# Patient Record
Sex: Female | Born: 1940 | Race: White | Hispanic: No | Marital: Single | Smoking: Former smoker
Health system: Southern US, Community
[De-identification: ages and names within clinical notes are randomized; demographics above are authoritative.]

## PROBLEM LIST (undated history)

## (undated) DIAGNOSIS — C801 Malignant (primary) neoplasm, unspecified: Secondary | ICD-10-CM

## (undated) DIAGNOSIS — I10 Essential (primary) hypertension: Secondary | ICD-10-CM

## (undated) DIAGNOSIS — J449 Chronic obstructive pulmonary disease, unspecified: Secondary | ICD-10-CM

## (undated) DIAGNOSIS — N189 Chronic kidney disease, unspecified: Secondary | ICD-10-CM

## (undated) DIAGNOSIS — R5381 Other malaise: Secondary | ICD-10-CM

---

## 2003-12-29 ENCOUNTER — Emergency Department (HOSPITAL_COMMUNITY): Admission: EM | Admit: 2003-12-29 | Discharge: 2003-12-29 | Payer: Self-pay | Admitting: Emergency Medicine

## 2004-01-02 ENCOUNTER — Emergency Department (HOSPITAL_COMMUNITY): Admission: EM | Admit: 2004-01-02 | Discharge: 2004-01-02 | Payer: Self-pay | Admitting: Emergency Medicine

## 2006-01-01 ENCOUNTER — Emergency Department (HOSPITAL_COMMUNITY): Admission: EM | Admit: 2006-01-01 | Discharge: 2006-01-01 | Payer: Self-pay | Admitting: Emergency Medicine

## 2006-11-09 ENCOUNTER — Emergency Department (HOSPITAL_COMMUNITY): Admission: EM | Admit: 2006-11-09 | Discharge: 2006-11-09 | Payer: Self-pay | Admitting: Emergency Medicine

## 2006-11-17 ENCOUNTER — Emergency Department (HOSPITAL_COMMUNITY): Admission: EM | Admit: 2006-11-17 | Discharge: 2006-11-18 | Payer: Self-pay | Admitting: Emergency Medicine

## 2006-11-21 ENCOUNTER — Inpatient Hospital Stay (HOSPITAL_COMMUNITY): Admission: EM | Admit: 2006-11-21 | Discharge: 2006-11-24 | Payer: Self-pay | Admitting: Emergency Medicine

## 2006-11-24 ENCOUNTER — Emergency Department (HOSPITAL_COMMUNITY): Admission: EM | Admit: 2006-11-24 | Discharge: 2006-11-24 | Payer: Self-pay | Admitting: Emergency Medicine

## 2007-10-15 ENCOUNTER — Emergency Department (HOSPITAL_COMMUNITY): Admission: EM | Admit: 2007-10-15 | Discharge: 2007-10-15 | Payer: Self-pay | Admitting: Emergency Medicine

## 2008-11-11 ENCOUNTER — Inpatient Hospital Stay (HOSPITAL_COMMUNITY): Admission: EM | Admit: 2008-11-11 | Discharge: 2008-11-16 | Payer: Self-pay | Admitting: Emergency Medicine

## 2008-11-13 ENCOUNTER — Ambulatory Visit: Payer: Self-pay | Admitting: Cardiology

## 2008-11-13 ENCOUNTER — Encounter (INDEPENDENT_AMBULATORY_CARE_PROVIDER_SITE_OTHER): Payer: Self-pay | Admitting: Internal Medicine

## 2008-11-14 ENCOUNTER — Ambulatory Visit: Payer: Self-pay | Admitting: Cardiology

## 2009-05-23 ENCOUNTER — Inpatient Hospital Stay (HOSPITAL_COMMUNITY): Admission: EM | Admit: 2009-05-23 | Discharge: 2009-05-23 | Payer: Self-pay | Admitting: Emergency Medicine

## 2009-05-23 ENCOUNTER — Emergency Department (HOSPITAL_COMMUNITY): Admission: EM | Admit: 2009-05-23 | Discharge: 2009-05-23 | Payer: Self-pay | Admitting: Emergency Medicine

## 2009-05-24 ENCOUNTER — Observation Stay (HOSPITAL_COMMUNITY): Admission: EM | Admit: 2009-05-24 | Discharge: 2009-05-27 | Payer: Self-pay | Admitting: Emergency Medicine

## 2009-06-05 ENCOUNTER — Emergency Department (HOSPITAL_COMMUNITY): Admission: EM | Admit: 2009-06-05 | Discharge: 2009-06-05 | Payer: Self-pay | Admitting: Emergency Medicine

## 2009-10-05 ENCOUNTER — Emergency Department (HOSPITAL_COMMUNITY): Admission: EM | Admit: 2009-10-05 | Discharge: 2009-10-05 | Payer: Self-pay | Admitting: Emergency Medicine

## 2010-10-19 ENCOUNTER — Encounter: Payer: Self-pay | Admitting: Internal Medicine

## 2010-12-14 LAB — POCT CARDIAC MARKERS
CKMB, poc: 6.1 ng/mL (ref 1.0–8.0)
Myoglobin, poc: 210 ng/mL (ref 12–200)
Troponin i, poc: <0.05 ng/mL (ref 0.00–0.09)

## 2010-12-14 LAB — DIFFERENTIAL
Basophils Relative: 1 % (ref 0–1)
Eosinophils Absolute: 0.1 10*3/uL (ref 0.0–0.7)
Monocytes Absolute: 0.1 10*3/uL (ref 0.1–1.0)
Neutrophils Relative %: 81 % — ABNORMAL HIGH (ref 43–77)

## 2010-12-14 LAB — BASIC METABOLIC PANEL
BUN: 34 mg/dL — ABNORMAL HIGH (ref 6–23)
CO2: 27 mEq/L (ref 19–32)

## 2010-12-14 LAB — BRAIN NATRIURETIC PEPTIDE: Pro B Natriuretic peptide (BNP): 48 pg/mL (ref 0.0–100.0)

## 2010-12-14 LAB — CBC
Hemoglobin: 11.8 g/dL — ABNORMAL LOW (ref 12.0–15.0)
MCHC: 34 g/dL (ref 30.0–36.0)
MCV: 96.5 fL (ref 78.0–100.0)

## 2011-01-02 LAB — DIFFERENTIAL
Lymphs Abs: 1.4 10*3/uL (ref 0.7–4.0)
Monocytes Relative: 7 % (ref 3–12)
Neutro Abs: 7.8 10*3/uL — ABNORMAL HIGH (ref 1.7–7.7)
Neutrophils Relative %: 77 % (ref 43–77)

## 2011-01-02 LAB — CBC
Hemoglobin: 13.2 g/dL (ref 12.0–15.0)
MCHC: 34.5 g/dL (ref 30.0–36.0)
Platelets: 390 10*3/uL (ref 150–400)
RDW: 12.4 % (ref 11.5–15.5)

## 2011-01-02 LAB — COMPREHENSIVE METABOLIC PANEL
Albumin: 3.5 g/dL (ref 3.5–5.2)
BUN: 18 mg/dL (ref 6–23)
Calcium: 9.4 mg/dL (ref 8.4–10.5)
Glucose, Bld: 129 mg/dL — ABNORMAL HIGH (ref 70–99)
Sodium: 141 mEq/L (ref 135–145)
Total Protein: 6.8 g/dL (ref 6.0–8.3)

## 2011-01-02 LAB — POCT CARDIAC MARKERS
CKMB, poc: 4.5 ng/mL (ref 1.0–8.0)
Myoglobin, poc: 125 ng/mL (ref 12–200)
Troponin i, poc: <0.05 ng/mL (ref 0.00–0.09)

## 2011-01-03 LAB — GLUCOSE, CAPILLARY
Glucose-Capillary: 100 mg/dL — ABNORMAL HIGH (ref 70–99)
Glucose-Capillary: 134 mg/dL — ABNORMAL HIGH (ref 70–99)
Glucose-Capillary: 138 mg/dL — ABNORMAL HIGH (ref 70–99)

## 2011-01-03 LAB — DIFFERENTIAL
Basophils Absolute: 0 10*3/uL (ref 0.0–0.1)
Basophils Absolute: 0.1 10*3/uL (ref 0.0–0.1)
Basophils Absolute: 0.3 10*3/uL — ABNORMAL HIGH (ref 0.0–0.1)
Basophils Absolute: 0.4 10*3/uL — ABNORMAL HIGH (ref 0.0–0.1)
Basophils Relative: 0 % (ref 0–1)
Basophils Relative: 1 % (ref 0–1)
Basophils Relative: 1 % (ref 0–1)
Basophils Relative: 3 % — ABNORMAL HIGH (ref 0–1)
Eosinophils Absolute: 0 10*3/uL (ref 0.0–0.7)
Eosinophils Absolute: 0 10*3/uL (ref 0.0–0.7)
Eosinophils Absolute: 0 10*3/uL (ref 0.0–0.7)
Eosinophils Absolute: 0 10*3/uL (ref 0.0–0.7)
Eosinophils Relative: 0 % (ref 0–5)
Eosinophils Relative: 0 % (ref 0–5)
Lymphocytes Relative: 10 % — ABNORMAL LOW (ref 12–46)
Lymphocytes Relative: 25 % (ref 12–46)
Lymphocytes Relative: 9 % — ABNORMAL LOW (ref 12–46)
Lymphs Abs: 0.9 10*3/uL (ref 0.7–4.0)
Lymphs Abs: 1 10*3/uL (ref 0.7–4.0)
Lymphs Abs: 2 10*3/uL (ref 0.7–4.0)
Monocytes Absolute: 0.2 10*3/uL (ref 0.1–1.0)
Monocytes Absolute: 0.9 10*3/uL (ref 0.1–1.0)
Monocytes Absolute: 1.2 10*3/uL — ABNORMAL HIGH (ref 0.1–1.0)
Monocytes Absolute: 1.4 10*3/uL — ABNORMAL HIGH (ref 0.1–1.0)
Monocytes Relative: 7 % (ref 3–12)
Monocytes Relative: 9 % (ref 3–12)
Neutro Abs: 13.5 10*3/uL — ABNORMAL HIGH (ref 1.7–7.7)
Neutro Abs: 6.1 10*3/uL (ref 1.7–7.7)
Neutro Abs: 9.2 10*3/uL — ABNORMAL HIGH (ref 1.7–7.7)
Neutrophils Relative %: 79 % — ABNORMAL HIGH (ref 43–77)
Neutrophils Relative %: 81 % — ABNORMAL HIGH (ref 43–77)
Neutrophils Relative %: 84 % — ABNORMAL HIGH (ref 43–77)
Neutrophils Relative %: 89 % — ABNORMAL HIGH (ref 43–77)
Neutrophils Relative %: 89 % — ABNORMAL HIGH (ref 43–77)

## 2011-01-03 LAB — CBC
HCT: 38 % (ref 36.0–46.0)
HCT: 40.6 % (ref 36.0–46.0)
Hemoglobin: 11.8 g/dL — ABNORMAL LOW (ref 12.0–15.0)
Hemoglobin: 12.9 g/dL (ref 12.0–15.0)
Hemoglobin: 13.1 g/dL (ref 12.0–15.0)
MCHC: 34.4 g/dL (ref 30.0–36.0)
MCHC: 34.7 g/dL (ref 30.0–36.0)
MCHC: 34.7 g/dL (ref 30.0–36.0)
MCV: 96.7 fL (ref 78.0–100.0)
MCV: 96.8 fL (ref 78.0–100.0)
MCV: 97.1 fL (ref 78.0–100.0)
Platelets: 347 10*3/uL (ref 150–400)
Platelets: 365 10*3/uL (ref 150–400)
Platelets: 442 10*3/uL — ABNORMAL HIGH (ref 150–400)
RBC: 3.54 MIL/uL — ABNORMAL LOW (ref 3.87–5.11)
RBC: 3.89 MIL/uL (ref 3.87–5.11)
RDW: 12.6 % (ref 11.5–15.5)
RDW: 12.6 % (ref 11.5–15.5)
RDW: 12.7 % (ref 11.5–15.5)
RDW: 12.8 % (ref 11.5–15.5)
WBC: 10.3 10*3/uL (ref 4.0–10.5)
WBC: 23.1 10*3/uL — ABNORMAL HIGH (ref 4.0–10.5)
WBC: 9.5 10*3/uL (ref 4.0–10.5)

## 2011-01-03 LAB — PHOSPHORUS: Phosphorus: 4 mg/dL (ref 2.3–4.6)

## 2011-01-03 LAB — COMPREHENSIVE METABOLIC PANEL
ALT: 29 U/L (ref 0–35)
ALT: 35 U/L (ref 0–35)
AST: 44 U/L — ABNORMAL HIGH (ref 0–37)
Albumin: 3.6 g/dL (ref 3.5–5.2)
Alkaline Phosphatase: 58 U/L (ref 39–117)
CO2: 31 mEq/L (ref 19–32)
Calcium: 8.2 mg/dL — ABNORMAL LOW (ref 8.4–10.5)
Calcium: 9 mg/dL (ref 8.4–10.5)
Chloride: 101 mEq/L (ref 96–112)
GFR calc Af Amer: 58 mL/min — ABNORMAL LOW (ref >60)
GFR calc non Af Amer: >60 mL/min (ref >60)
Glucose, Bld: 168 mg/dL — ABNORMAL HIGH (ref 70–99)
Potassium: 3.1 mEq/L — ABNORMAL LOW (ref 3.5–5.1)
Sodium: 135 mEq/L (ref 135–145)
Sodium: 139 mEq/L (ref 135–145)
Total Bilirubin: 0.9 mg/dL (ref 0.3–1.2)
Total Protein: 6.3 g/dL (ref 6.0–8.3)

## 2011-01-03 LAB — BASIC METABOLIC PANEL
BUN: 16 mg/dL (ref 6–23)
BUN: 29 mg/dL — ABNORMAL HIGH (ref 6–23)
CO2: 31 mEq/L (ref 19–32)
Calcium: 9.2 mg/dL (ref 8.4–10.5)
Calcium: 9.7 mg/dL (ref 8.4–10.5)
Chloride: 96 mEq/L (ref 96–112)
Creatinine, Ser: 1.05 mg/dL (ref 0.4–1.2)
Creatinine, Ser: 1.2 mg/dL (ref 0.4–1.2)
GFR calc Af Amer: >60 mL/min (ref >60)
GFR calc non Af Amer: 55 mL/min — ABNORMAL LOW (ref >60)
GFR calc non Af Amer: 55 mL/min — ABNORMAL LOW (ref >60)
Glucose, Bld: 152 mg/dL — ABNORMAL HIGH (ref 70–99)
Glucose, Bld: 97 mg/dL (ref 70–99)
Potassium: 4 mEq/L (ref 3.5–5.1)
Sodium: 141 mEq/L (ref 135–145)

## 2011-01-03 LAB — URINE MICROSCOPIC-ADD ON

## 2011-01-03 LAB — POCT CARDIAC MARKERS
CKMB, poc: 3 ng/mL (ref 1.0–8.0)
CKMB, poc: 5.7 ng/mL (ref 1.0–8.0)
Troponin i, poc: <0.05 ng/mL (ref 0.00–0.09)

## 2011-01-03 LAB — URINALYSIS, ROUTINE W REFLEX MICROSCOPIC
Glucose, UA: NEGATIVE mg/dL
Leukocytes, UA: NEGATIVE
pH: 5.5 (ref 5.0–8.0)

## 2011-01-03 LAB — MAGNESIUM: Magnesium: 1.9 mg/dL (ref 1.5–2.5)

## 2011-01-13 LAB — CARDIAC PANEL(CRET KIN+CKTOT+MB+TROPI)
CK, MB: 3.6 ng/mL (ref 0.3–4.0)
CK, MB: 4.5 ng/mL — ABNORMAL HIGH (ref 0.3–4.0)
CK, MB: 3.3 ng/mL (ref 0.3–4.0)
CK, MB: 3.4 ng/mL (ref 0.3–4.0)
CK, MB: 3.9 ng/mL (ref 0.3–4.0)
Relative Index: 3.2 — ABNORMAL HIGH (ref 0.0–2.5)
Relative Index: 3.3 — ABNORMAL HIGH (ref 0.0–2.5)
Relative Index: 3.4 — ABNORMAL HIGH (ref 0.0–2.5)
Total CK: 107 U/L (ref 7–177)
Total CK: 138 U/L (ref 7–177)
Total CK: 144 U/L (ref 7–177)
Total CK: 58 U/L (ref 7–177)
Total CK: 64 U/L (ref 7–177)
Troponin I: <0.01 ng/mL (ref 0.00–0.06)
Troponin I: <0.01 ng/mL (ref 0.00–0.06)
Troponin I: <0.01 ng/mL (ref 0.00–0.06)
Troponin I: 0.01 ng/mL (ref 0.00–0.06)
Troponin I: 0.01 ng/mL (ref 0.00–0.06)

## 2011-01-13 LAB — CBC
HCT: 38 % (ref 36.0–46.0)
HCT: 39 % (ref 36.0–46.0)
Hemoglobin: 12.6 g/dL (ref 12.0–15.0)
Hemoglobin: 12.8 g/dL (ref 12.0–15.0)
MCHC: 33.1 g/dL (ref 30.0–36.0)
MCHC: 33.8 g/dL (ref 30.0–36.0)
MCHC: 34.2 g/dL (ref 30.0–36.0)
MCV: 95.2 fL (ref 78.0–100.0)
MCV: 94.3 fL (ref 78.0–100.0)
MCV: 94.9 fL (ref 78.0–100.0)
Platelets: 421 10*3/uL — ABNORMAL HIGH (ref 150–400)
Platelets: 422 10*3/uL — ABNORMAL HIGH (ref 150–400)
Platelets: 435 10*3/uL — ABNORMAL HIGH (ref 150–400)
RBC: 3.89 MIL/uL (ref 3.87–5.11)
RDW: 13.6 % (ref 11.5–15.5)
RDW: 13.4 % (ref 11.5–15.5)
RDW: 13.4 % (ref 11.5–15.5)
RDW: 13.4 % (ref 11.5–15.5)
WBC: 14.7 10*3/uL — ABNORMAL HIGH (ref 4.0–10.5)
WBC: 8.8 10*3/uL (ref 4.0–10.5)

## 2011-01-13 LAB — POCT CARDIAC MARKERS
CKMB, poc: 3.5 ng/mL (ref 1.0–8.0)
Myoglobin, poc: 145 ng/mL (ref 12–200)
Myoglobin, poc: 296 ng/mL (ref 12–200)

## 2011-01-13 LAB — BASIC METABOLIC PANEL
BUN: 16 mg/dL (ref 6–23)
BUN: 15 mg/dL (ref 6–23)
BUN: 19 mg/dL (ref 6–23)
BUN: 22 mg/dL (ref 6–23)
CO2: 32 mEq/L (ref 19–32)
CO2: 29 mEq/L (ref 19–32)
CO2: 34 mEq/L — ABNORMAL HIGH (ref 19–32)
Calcium: 9.3 mg/dL (ref 8.4–10.5)
Calcium: 8.8 mg/dL (ref 8.4–10.5)
Chloride: 102 mEq/L (ref 96–112)
Chloride: 100 mEq/L (ref 96–112)
Chloride: 104 mEq/L (ref 96–112)
Chloride: 98 mEq/L (ref 96–112)
Creatinine, Ser: 0.79 mg/dL (ref 0.4–1.2)
Creatinine, Ser: 0.79 mg/dL (ref 0.4–1.2)
Creatinine, Ser: 0.81 mg/dL (ref 0.4–1.2)
GFR calc Af Amer: >60 mL/min (ref >60)
GFR calc Af Amer: >60 mL/min (ref >60)
GFR calc non Af Amer: >60 mL/min (ref >60)
GFR calc non Af Amer: >60 mL/min (ref >60)
Glucose, Bld: 100 mg/dL — ABNORMAL HIGH (ref 70–99)
Glucose, Bld: 107 mg/dL — ABNORMAL HIGH (ref 70–99)
Glucose, Bld: 137 mg/dL — ABNORMAL HIGH (ref 70–99)
Glucose, Bld: 138 mg/dL — ABNORMAL HIGH (ref 70–99)
Potassium: 3.3 mEq/L — ABNORMAL LOW (ref 3.5–5.1)
Potassium: 3.9 mEq/L (ref 3.5–5.1)
Sodium: 138 mEq/L (ref 135–145)
Sodium: 141 mEq/L (ref 135–145)

## 2011-01-13 LAB — DIFFERENTIAL
Basophils Absolute: 0 10*3/uL (ref 0.0–0.1)
Basophils Absolute: 0 10*3/uL (ref 0.0–0.1)
Basophils Absolute: 0 10*3/uL (ref 0.0–0.1)
Basophils Absolute: 0.1 10*3/uL (ref 0.0–0.1)
Basophils Relative: 0 % (ref 0–1)
Basophils Relative: 0 % (ref 0–1)
Eosinophils Absolute: 0.2 10*3/uL (ref 0.0–0.7)
Eosinophils Absolute: 0 10*3/uL (ref 0.0–0.7)
Eosinophils Absolute: 0 10*3/uL (ref 0.0–0.7)
Eosinophils Absolute: 0 10*3/uL (ref 0.0–0.7)
Eosinophils Relative: 0 % (ref 0–5)
Eosinophils Relative: 0 % (ref 0–5)
Lymphocytes Relative: 13 % (ref 12–46)
Lymphocytes Relative: 5 % — ABNORMAL LOW (ref 12–46)
Lymphocytes Relative: 7 % — ABNORMAL LOW (ref 12–46)
Lymphs Abs: 0.6 10*3/uL — ABNORMAL LOW (ref 0.7–4.0)
Lymphs Abs: 0.8 10*3/uL (ref 0.7–4.0)
Monocytes Absolute: 0.1 10*3/uL (ref 0.1–1.0)
Monocytes Absolute: 1 10*3/uL (ref 0.1–1.0)
Monocytes Relative: 1 % — ABNORMAL LOW (ref 3–12)
Neutro Abs: 4.4 10*3/uL (ref 1.7–7.7)
Neutro Abs: 13.8 10*3/uL — ABNORMAL HIGH (ref 1.7–7.7)
Neutrophils Relative %: 68 % (ref 43–77)
Neutrophils Relative %: 90 % — ABNORMAL HIGH (ref 43–77)
Neutrophils Relative %: 92 % — ABNORMAL HIGH (ref 43–77)
Neutrophils Relative %: 94 % — ABNORMAL HIGH (ref 43–77)

## 2011-01-13 LAB — CULTURE, BLOOD (ROUTINE X 2)
Culture: NO GROWTH
Culture: NO GROWTH

## 2011-01-13 LAB — BLOOD GAS, ARTERIAL
Acid-Base Excess: 3.4 mmol/L — ABNORMAL HIGH (ref 0.0–2.0)
O2 Content: 2 L/min
O2 Saturation: 96.7 %
pO2, Arterial: 86.6 mmHg (ref 80.0–100.0)

## 2011-01-13 LAB — LIPID PANEL
LDL Cholesterol: 105 mg/dL — ABNORMAL HIGH (ref 0–99)
Total CHOL/HDL Ratio: 3.4 RATIO
VLDL: 10 mg/dL (ref 0–40)

## 2011-02-10 NOTE — Discharge Summary (Signed)
Cheyenne Case, Cheyenne Case                 ACCOUNT NO.:  [id number]   MEDICAL RECORD NO.:  [id number]          PATIENT TYPE:  OBV   LOCATION:  U132                          FACILITY:  [facility]   PHYSICIAN:  Barbette Merino, M.D.      DATE OF BIRTH:  1941-02-13   DATE OF ADMISSION:  05/24/2009  DATE OF DISCHARGE:  08/30/2010LH                               DISCHARGE SUMMARY   PRIMARY CARE PHYSICIAN:  Leonides Grills, M.D.   DISCHARGE DIAGNOSES:  1. Acute gastritis.  2. Chronic obstructive pulmonary disease with exacerbation.  3. Dehydration.  4. Hypokalemia.  5. Lower extremity edema.   DISCHARGE MEDICATIONS:  1. Levaquin 500 mg daily x4 more days.  2. DuoNeb nebulizer 4 times a day.  3. Lasix 20 mg daily.  4. Lisinopril 40 mg daily.  5. Prednisone taper, currently 20 mg daily.  6. Mucinex 600 mg by mouth daily.   DISPOSITION:  The patient is stable and in good health.  We tried to  place her in an assisted living facility, but that has failed due to her  lack of Medicaid, Medicare, or any form of Insurance.   PROCEDURES PERFORMED THIS ADMISSION:  Include acute abdominal series on  May 24, 2009, that showed unremarkable bowel gas pattern, abnormal  contour of the right paraspinal region in the lower thoracic spine with  lucency involving the thoracic spine, and maybe a new small hiatal  hernia.  CT abdomen and pelvis showed new moderate sized hiatal hernia,  interval increase in size of left renal cyst, no acute abnormalities in  the pelvis, but degenerative changes noted along the lumbar spine with  minimal grade 1 anterior listhesis of L4-5.   CONSULTATIONS:  None.   BRIEF HISTORY AND PHYSICAL:  Please refer to my dictated history and  physical on admission.  In short, however, the patient is a 70 year old  that was just released from the hospital after admission with COPD  exacerbation on May 23, 2009.  She returned on May 24, 2009, with  acute nausea and vomiting after  eating a pizza.  The patient was  subsequently evaluated and admitted with intractable nausea and  vomiting, most likely due to acute food poisoning and gastritis.   HOSPITAL COURSE:  1. Acute gastritis.  The patient was placed initially on bowel rest      with IV fluids and nausea controlled with Zofran and Phenergan.      She responded quickly and within 24 hours her nausea and vomiting      had stopped.  We gradually got her back on her regular diet.  She      is currently stable, eating and tolerating diet well.  2. COPD exacerbation.  The patient has been on steroid taper and      nebulizers, which we continued in the hospital.  She will complete      her taper now at home.  3. Dehydration secondary to her nausea and vomiting.  She has been      hydrated adequately.  4. Hypokalemia.  Also her potassium was repleted  appropriately.  She      had a transient hypokalemia.  5. Lower extremity edema.  The patient has been on Lasix for that and      we continued with that.  6. New diagnosis of hiatal hernia.  She has been on PPI in the      hospital.  The patient cannot afford PPI as an outpatient.   We will now discharge her home.  The patient has been advised to apply  for Medicaid so she can subsequently go to an assisted living facility.      Barbette Merino, M.D.  Electronically Signed     LG/MEDQ  D:  05/27/2009  T:  05/27/2009  Job:  [id number]

## 2011-02-10 NOTE — Consult Note (Signed)
Cheyenne Case, Cheyenne Case                 ACCOUNT NO.:  [id number]   MEDICAL RECORD NO.:  [id number]          PATIENT TYPE:  INP   LOCATION:  A341                          FACILITY:  [facility]   PHYSICIAN:  Cristopher Estimable. Lattie Haw, MD, FACCDATE OF BIRTH:  12/24/1940   DATE OF CONSULTATION:  11/13/2008  DATE OF DISCHARGE:                                 CONSULTATION   REFERRING PHYSICIAN:  Dr. Barbette Merino of the [facility] Hospitalist Team  P.   PRIMARY CARE PHYSICIAN:  Dr. Orson Ape.   CARDIOLOGIST:  She will be new to Dr. Lattie Haw.   REASON FOR CONSULTATION:  Palpitations.   HISTORY OF PRESENT ILLNESS:  Cheyenne Case is a 70 year old female with a  history of COPD who was admitted with a COPD exacerbation on November 11, 2008.  The patient has noted palpitations that began 1 to 2 weeks  prior to admission.  She feels as though her heart rate increases at  times and goes too slow at other times.  She notes some chest fullness.  This occurs at any time and is not related to exertion.  She denies any  radiating symptoms or associated nausea or diaphoresis.  She denies  syncope or near syncope.  She is chronically short of breath and notes  her shortness of breath has only gotten worse over the last 2 to 4  weeks.  She describes NYHA class III B symptoms.  Her TSH has thus far  been normal at 3.177, BNP is minimally elevated at 189.  Cardiac markers  have been negative x3.  Telemetry has demonstrated normal sinus rhythm  with sinus tachycardia.  We have been asked to further evaluate.   PAST MEDICAL HISTORY:  As outlined above.   MEDICATIONS AT HOME:  1. Albuterol nebulizer 3 times a day.  2. Tylenol p.r.n.  3. Spiriva p.r.n.   ALLERGIES:  No known drug allergies.   SOCIAL HISTORY:  The patient lives in [place] by herself.  She is a  retired Psychologist, sport and exercise.  She has a 50-100 pack-year history of smoking.  She  quit smoking several years ago.  She denies alcohol or drug abuse.   FAMILY HISTORY:   Insignificant significant for CAD.   REVIEW OF SYSTEMS:  Please see HPI.  She denies fevers, chills,  headache.  She has had a rash to her bilateral legs.  She denies  orthopnea or PND.  She notes pedal edema.  She notes cough.  She denies  syncope or near syncope.  She denies dysuria, hematuria, bright red  blood per rectum or melena.  All other systems reviewed and are  negative.   PHYSICAL EXAM:  CONSTITUTIONAL:  She is a chronically ill-appearing  female in no acute distress.  VITAL SIGNS:  Blood pressure 139/61, pulse 82, respirations 20,  temperature 96.2, oxygen saturation 98% on 2 liters, weight 68.8 kg.  Ins and Outs negative.  HEENT:  Normal.  NECK:  With minimal JVD.  LYMPHATICS:  without lymphadenopathy.  ENDOCRINE:  Without thyromegaly.  CARDIAC:  Normal S1, S2, regular rate and rhythm without murmur.  LUNGS:  With inspiratory and expiratory wheezes throughout.  No rales.  Increased expiratory phase.  SKIN:  With maculopapular eruption to the left back with excoriations.  Erythema and extreme drying to both lower extremities.  ABDOMEN:  Soft, nontender with normoactive bowel sounds.  No  organomegaly.  EXTREMITIES:  With 1+ edema bilaterally with stasis changes and scaling.  MUSCULOSKELETAL:  Without joint deformity.  NEUROLOGIC:  She is alert and oriented x3.  Cranial nerves II-XII  grossly intact.  VASCULAR:  Exam without carotid bruits bilaterally.   Chest x-ray no acute disease.  COPD/emphysema.  EKG unavailable.   LABORATORY DATA:  White count 8100, hemoglobin 12.2, platelet count  421,000.  Potassium 4.4, creatinine 0.81, glucose 138, CK-MB and  troponin negative x3.  TSH BNP as outlined above.  Blood cultures  negative times 1 day.  Total cholesterol 163, triglycerides 51, HDL 48,  LDL 105.   ASSESSMENT:  1. Palpitations in the absence of documented arrhythmia.  2. Chest discomfort.  3. Chronic obstructive pulmonary disease with recent exacerbation.  4.  Chronic lower extremity edema.  She has no significant jugular      venous distension and normal renal function.  This is possibly      secondary to venous insufficiency.   RECOMMENDATIONS:  The patient was also interviewed and examined by Dr.  Lattie Haw.  No abnormalities have been noted thus far on telemetry.  Her  TSH is normal.  An echocardiogram will be obtained.  An outpatient event  monitor can certainly be considered if her inpatient monitoring is  unrevealing.  She has significant cardiovascular  risk with her history of chest discomfort.  She should be assessed with  a pharmacological stress test at some point.  Thank you very much for  the consultation.  We will be glad to follow the patient throughout the  remainder of this admission.      Richardson Dopp, PA-C      Cristopher Estimable. Lattie Haw, MD, [facility]  Electronically Signed    SW/MEDQ  D:  11/13/2008  T:  11/13/2008  Job:  [id number]   cc:   Barbette Merino, M.D.   Leonides Grills, M.D.  Fax: [phone]

## 2011-02-10 NOTE — H&P (Signed)
Cheyenne Case, Cheyenne Case NO.:  [id number]   MEDICAL RECORD NO.:  [id number]          PATIENT TYPE:  EMS   LOCATION:  ED                            FACILITY:  [facility]   PHYSICIAN:  Barbette Merino, M.D.      DATE OF BIRTH:  Mar 19, 1941   DATE OF ADMISSION:  05/24/2009  DATE OF DISCHARGE:  LH                              HISTORY & PHYSICAL   PRIMARY CARE PHYSICIAN:  Leonides Grills, M.D.   PRESENTING COMPLAINT:  Intractable nausea, vomiting.   HISTORY OF PRESENT ILLNESS:  The patient is 70 year old female that was  just discharged yesterday after 2-day hospitalization with acute COPD  exacerbation.  The patient was discharged home on p.o. Levaquin,  prednisone and her Lasix.  She was doing fine until lunchtime, when she  ate pizza.  Right after eating the pizza, she started feeling sick in  her stomach and started throwing up.  The patient states she has  continued this on and off since then until she came to the emergency  room.  She has some abdominal discomfort, associated with no fever no  chills.  She has been unable to keep most of her food down.  She denied  any sick contacts.  No dizziness.   PAST MEDICAL HISTORY:  Significant for COPD exacerbation, tobacco abuse,  hypertension and lower extremity edema.   ALLERGIES:  The patient says to be allergic to some form of STEROIDS.   MEDICATIONS INCLUDE:  1. DuoNeb nebulizer four times a day.  2. Levaquin 500 mg daily.  3. Lasix 20 mg twice a day  4. Lisinopril 40 mg daily.   She was also discharged with steroid taper yesterday, starting with  prednisone at 60 mg daily.   FAMILY HISTORY AND SOCIAL HISTORY:  See my dictation 4 days ago.   REVIEW OF SYSTEMS:  Fourteen-point review of systems is negative except  for HPI.   PHYSICAL EXAM:  VITAL SIGNS:  Temperature 98.1, blood pressure is  115/64, pulse of 94, respiratory rate 21, sats 99% on 2 liters.  GENERAL:  The patient is awake, alert, oriented, in no acute  distress.  HEENT: PERRL.  EOMI.  NECK:  Supple.  No JVD, no lymphadenopathy.  RESPIRATORY:  Shows good air entry bilaterally.  No wheezes or rales.  CARDIOVASCULAR SYSTEM:  Shows S1, S2, no murmur.  ABDOMEN:  Soft and nontender with positive bowel sounds.  EXTREMITIES:  Show no edema, cyanosis or clubbing.   LABS:  White count 15.9, hemoglobin 12.9, platelet count 365 with a left  shift.  ANC of 12.7.  Myoglobin 407, otherwise troponin negative.  Sodium 135, potassium 3.1, chloride 98, CO2 31, glucose 136, BUN 28,  creatinine 1.13, calcium 9.0, total protein 6.3, albumin 3.6, AST 44.  Lipase is 19.  PT 13.9, INR 1.1, PTT 23.  Urinalysis is negative.   ASSESSMENT:  This is a 70 year old female, just discharged yesterday and  this now back with a different problem this time around.  She is having  what appeared to be an acute gastritis.  This followed  her eating  pizza.  The cause of her intractable nausea, vomiting could be due to  the prednisone, having some steroid-induced gastritis, but more than  likely is food poisoning.   PLAN:  1. Gastritis.  Will admit the patient and rest the bowels.  Give her      IV fluids.  The patient's lipase was normal.  Mainly conservative      management with Phenergan, Zofran, etc.  Once she is able to eat      and we advance her diet, then we can discharge her home.  2. Dehydration, secondary to repeated vomiting.  We will put her on D5      normal at this point.  3. Chronic obstructive pulmonary disease exacerbation.  The patient      has been on prednisone taper.  I will keep her on prednisone while      in the hospital.  4. Lower extremity edema.  That seems to have resolved.  I will hold      her Lasix for now.  5. Tobacco abuse.  The patient has been counseled.  6. Hypertension seems to be controlled.  7. Hypokalemia:  We will replete her potassium as much as possible.   Otherwise, further treatment will depend on how the patient does in  the  hospital.      Barbette Merino, M.D.  Electronically Signed     LG/MEDQ  D:  05/24/2009  T:  05/24/2009  Job:  [id number]   cc:   Leonides Grills, M.D.  Fax: [phone]

## 2011-02-10 NOTE — Discharge Summary (Signed)
NAMESKYLAN, GIFT                 ACCOUNT NO.:  [id number]   MEDICAL RECORD NO.:  [id number]          PATIENT TYPE:  INP   LOCATION:  A341                          FACILITY:  [facility]   PHYSICIAN:  Salem Caster, DO    DATE OF BIRTH:  [birth year]   DATE OF ADMISSION:  11/11/2008  DATE OF DISCHARGE:  02/19/2010LH                               DISCHARGE SUMMARY   DISCHARGE DIAGNOSES:  1. Palpitations, resolved.  2. History of chronic obstructive pulmonary disease.  3. History of chronic stasis dermatitis.  4. History of tobacco abuse.   PRIMARY CARE PHYSICIAN:  Dr. Orson Ape.   BRIEF HOSPITAL COURSE:  This is a 70 year old female with history of  COPD, who presented to the emergency room for shortness of breath and  tachycardia.  The patient states that secondary to her being short of  breath she was taking her inhalers at home but they did not seem to  work.  Secondary to her breathing difficulty and palpitations, they  decided to bring the patient to the ER.  The patient was also  complaining of some increasing edema in her lower extremities.  The  patient does have a history of chronic stasis dermatitis.  The patient  describes some vague chest pain and some anxiety-type symptoms also when  she presented to the emergency room.  When the patient presented initial  labs showed a white count of 6500, hemoglobin 12.9, platelet count  463,000, glucose 100, BUN 16, creatinine 0.9.  Chest x-ray showed no  evidence of acute cardiopulmonary disease, but did show COPD and  emphysema.  BNP was 189.  The patient was admitted for COPD  exacerbation, palpitations and chronic stasis dermatitis.  Secondary to  her palpitations, cardiology was consulted.  A 2-D echocardiogram was  ordered which showed overall left ventricular systolic function was  hyperdynamic.  Left ventricular ejection fraction was estimated to be  between 70-75%.  After being evaluated by cardiology, they felt her  palpitations  were probably secondary to her nebulizer treatments with  albuterol.  They recommended we switch her to Xopenex.  They felt that  her chest pain was atypical.  Her cardiac enzymes were unremarkable.  They felt that the patient could be discharged with albuterol and to  follow up her blood pressure with her primary care physician.  The  patient states that she feels well.  Her chest pain and shortness of  breath has improved and her swelling in the lower extremities has also  improved.  At this time, we feel the patient is stable enough to be  discharged to home.  The patient's last chest x-ray was unremarkable and  showed no evidence of acute cardiopulmonary disease.   MEDICATIONS ON DISCHARGE:  1. Tylenol 650 every 4 hours as needed.  2. Xopenex 1.25 mg inhalation q.4 h., p.r.n.  3. Lisinopril 10 mg 1 tablet p.o. daily.  4. Medrol Dosepak as directed.   Vital Signs:  Temperature is 96.7, pulse 98, respirations 22, blood  pressure 122/66.  Saturating 95% on room air.   LABS:  Sodium 136, potassium  3.9, chloride 98, CO2 of 31, glucose 137,  BUN 19, creatinine 0.79, white count 8700, hemoglobin 13.4, hematocrit  39.0, platelet count 435,000.   CONDITION ON DISCHARGE:  Stable.   DISPOSITION:  The patient will be discharged to home.   DISCHARGE INSTRUCTIONS:  The patient is to maintain a low-salt, heart-  healthy diet.  Activity; increase her activity slowly.  The patient is  to follow-up with Dr. Orson Ape in the next 3-5 days.  The patient is take  all her medications as directed.  The patient is to return to emergency  room if she has any problems with shortness of breath, chest pain and/or  call 911 and her primary care physician.      Salem Caster, DO  Electronically Signed     SM/MEDQ  D:  11/16/2008  T:  11/16/2008  Job:  [id number]   cc:   Leonides Grills, M.D.  Fax: [phone]

## 2011-02-10 NOTE — Group Therapy Note (Signed)
NAMEANAIZ, QAZI NO.:  [id number]   MEDICAL RECORD NO.:  [id number]          PATIENT TYPE:  INP   LOCATION:  A341                          FACILITY:  [facility]   PHYSICIAN:  Salem Caster, DO    DATE OF BIRTH:  08-Oct-1940   DATE OF PROCEDURE:  11/15/2008  DATE OF DISCHARGE:                                 PROGRESS NOTE   SUBJECTIVE:  [name] states that she is feeling better.  The patient  is more mobile, she is going to the restroom on her own at this time.  The patient states that her leg swelling has improved.  She is now  seeing more dry skin on her lower extremities.  The patient is not  complaining about chest pain or shortness of breath.   OBJECTIVE:  VITAL SIGNS:  Temperature is 97.1, pulse 76, respirations  24, blood pressure 152/72.  She is saturating 93% on room air.  CARDIAC:  Normal S1-S2.  Regular rate and rhythm.  LUNGS:  She has some wheezes noted.  No rhonchi.  ABDOMEN:  Soft, nontender, nondistended.  Positive bowel sounds.  No  rigidity or guarding.  EXTREMITIES:  She had +1 bilateral edema with stasis changes and  multiple scaling areas noted.  NEUROLOGIC:  She is alert and oriented x3.  Cranial nerves II through  XII are grossly intact.   LABORATORY DATA:  Total creatinine kinase is 65, CK-MB 3.9, troponin  0.01.  So far, blood cultures are unremarkable.  Sodium 141, potassium  3.3, chloride 100, CO2 of 34, glucose 107, BUN 22, creatinine 0.82.  White count is 13.4, hemoglobin 12.8, hematocrit 38.0, platelet count is  426,000.   ASSESSMENT/PLAN:  1. Palpitations.  These seem to have resolved at this time.  The      patient did undergo an echocardiogram that showed overall left      systolic function with ejection fraction ranging between 70-75%.      The patient will be continued on aspirin and ACE inhibitor at this      time.  2. Chronic obstructive pulmonary disease, seems to be stable.  The      patient is not complaining of  any major shortness of breath.  We      will continue with breathing treatments and the patient will be      continued on antibiotics, as well as oral prednisone at this time.  3. Chronic stasis dermatitis.  The patient will continue with      moisturizing lotion as needed.   Anticipate the patient being discharged soon if okay by cardiology.      Salem Caster, DO  Electronically Signed     SM/MEDQ  D:  11/15/2008  T:  11/15/2008  Job:  [id number]

## 2011-02-10 NOTE — H&P (Signed)
Cheyenne Case, Cheyenne Case                 ACCOUNT NO.:  [id number]   MEDICAL RECORD NO.:  [id number]          PATIENT TYPE:  INP   LOCATION:  A341                          FACILITY:  [facility]   PHYSICIAN:  Barbette Merino, M.D.      DATE OF BIRTH:  1940/11/11   DATE OF ADMISSION:  11/11/2008  DATE OF DISCHARGE:  LH                              HISTORY & PHYSICAL   PRIMARY CARE PHYSICIAN:  Leonides Grills, M.D.   PRESENTING COMPLAINT:  Shortness of breath.   HPI:  The patient is a 70 year old female with history of COPD who  presented to the emergency room with severe shortness of breath and  tachycardia.  The patient has apparently been doing alright until the  onset of symptoms 2 days ago.  She has tried medications at home, mainly  inhalers, but that did not work.  She came with her brother.  She lives  alone.  She has had long-standing COPD.  On arrival to the ED, she was  unable to give adequate history because it was hard for her to speak.  She has also noticed increasing edema in her legs where she has had  chronic stasis dermatitis.  She had some chest pain and some anxiety  type symptoms.  She has had nonproductive cough associated with her  symptoms.  Otherwise, denied any nausea, vomiting, or any GI related  symptoms.   PAST MEDICAL HISTORY:  Significant only for COPD and tobacco abuse.   ALLERGIES:  She denied any drug allergies.   MEDICATIONS INCLUDE:  Albuterol nebulizers as needed and Spiriva  HandiHaler daily.   SOCIAL HISTORY:  The patient lives alone here in [place].  She was a  former smoker who quit a few months ago.  Denied any alcohol or IV drug  use.   FAMILY HISTORY:  Noncontributory.   REVIEW OF SYSTEMS:  Twelve-point review of systems is negative except  per HPI.   EXAM:  She is afebrile with a temperature of 98, blood pressure 106/88,  pulse of 182, respiratory rate 32, saturation is 100% on room air.  GENERAL:  The patient is healthy, she is currently on  BiPAP using  accessory muscles of respiration, in obvious respiratory distress.  HEENT:  PERRL.  EOMI.  NECK:  Supple.  No JVD, no lymphadenopathy.  RESPIRATORY:  The patient has decreased air entry bilaterally with  marked expiratory wheezing.  CARDIOVASCULAR SYSTEM:  She has S1-S2, no audible murmur.  ABDOMEN:  Soft, nontender, with positive bowel sounds.  EXTREMITIES:  No edema, cyanosis or clubbing.   LABS:  She has white count of 6.5, hemoglobin 12.9, platelet count 463.  Sodium 141, potassium 3.6, chloride 102, CO2 32, glucose 100, BUN 16,  creatinine 0.79.  Chest x-ray showed no evidence of acute  cardiopulmonary disease but evidence of COPD and emphysema.  Her BNP is  only 189.   ASSESSMENT:  Therefore, this is a 70 year old female presenting with  chronic obstructive pulmonary disease exacerbation and also having  chronic stasis dermatitis.   PLAN:  1. Chronic obstructive pulmonary disease exacerbation:  The patient is      currently on BiPAP and will continue with BiPAP.  We will keep her      on nebulizers and empiric antibiotics.  The patient reported      allergies to STEROIDS, but more or less jittery whenever she takes      steroids.  Will give her low dose of Solu-Medrol 80 mg over the      first 24 hours.  Once she starts getting agitated, we will stop the      steroids.  2. Chronic stasis dermatitis:  We will recommend elevation of her      feet, use of some Eucerin cream.  3. Questionable heart issues:  The patient complained of some heart      problem.  She believes that she has had some problems.  We will      order a 2D echo and, depending on the results, we will consider      cardiac consult.  Otherwise, we will continue with treatment until      the patient's respiratory issues are resolved.      Barbette Merino, M.D.  Electronically Signed     LG/MEDQ  D:  11/13/2008  T:  11/13/2008  Job:  [id number]

## 2011-02-10 NOTE — H&P (Signed)
Cheyenne Case, Cheyenne Case                 ACCOUNT NO.:  [id number]   MEDICAL RECORD NO.:  [id number]          PATIENT TYPE:  OBV   LOCATION:  A302                          FACILITY:  [facility]   PHYSICIAN:  Barbette Merino, M.D.      DATE OF BIRTH:  April 25, 1941   DATE OF ADMISSION:  05/21/2009  DATE OF DISCHARGE:  LH                              HISTORY & PHYSICAL   PRIMARY CARE PHYSICIAN:  Sherrilee Gilles. Gerarda Fraction, MD.   PRESENTING COMPLAINT:  Shortness of breath.   HISTORY OF PRESENT ILLNESS:  The patient is a 70 year old female that  was last admitted in February 2010 with known history of COPD and  tobacco abuse, who quit tobacco apparently about 3 months ago.  The  patient came in today complaining of shortness of breath that has been  going on gradually for the past 3 days.  She was seen by Dr. Gerarda Fraction and  was given Levaquin as an outpatient; but her symptoms persisted.  She  was seen in the ED, initially treated, and was being discharged when she  desaturated and continued to have more shortness of breath.  Hence, she  is being admitted for further management.   She currently has denied any chest pain, but some mild cough --  nonproductive.  Shortness of breath is at rest with some wheezing.  She  denied any sick contacts.  Denied any nasal discharge.  No fever or  headaches.   PAST MEDICAL HISTORY:  1. COPD.  2. Hypertension.  3. Lower extremity edema.  4. Chronic stasis dermatitis.   ALLERGIES:  She denied any drug allergies.   MEDICATIONS:  Currently include:  1. Albuterol nebulizer.  2. Levaquin.  3. Lasix.  The patient is unsure of her doses.   SOCIAL HISTORY:  The patient lives in [place].  She quit smoking  about 3 months ago.  Has previously smoked for over 35 years, about a  pack per day.  Denied any alcohol or IV drug use.   FAMILY HISTORY:  Significant only for hypertension.   REVIEW OF SYSTEMS:  A 14-point review of systems is negative except per  HPI.   PHYSICAL  EXAMINATION:  Temperature 98.0, blood pressure 160/68  initially; with a pulse of 110, respiratory rate 26, saturations 88% on  room air.  GENERAL:  The patient looks a little bit anxious; awake, alert, oriented  but in no acute distress.  HEENT:  PERRLA.  EOMI.  NECK:  Supple.  No JVD, no lymphadenopathy.  RESPIRATORY:  She has decreased air entry bilaterally, with some mild  expiratory wheezing.  No rales or crackles.  CARDIOVASCULAR:  The patient is mildly tachycardic.  ABDOMEN:  Soft, nontender with positive bowel sounds.  EXTREMITIES:  Showed 1-2+ pedal edema, with evidence of venous stasis  dermatitis.   LABS:  Sodium 141, potassium 3.5, chloride 100, CO2 of 37, glucose 91,  BUN 18, creatinine 1.0, calcium 9.7.  White count 9.5, hemoglobin 13.1,  platelet count 380; with normal differentials.  Chest x-ray essentially  showed stable chest and no acute disease.  ASSESSMENT:  This is a 70 year old presenting with what appears to be  chronic obstructive pulmonary disease exacerbation.  The patient also  has improved in lower extremity edema.   PLAN:  1. Acute chronic obstructive pulmonary disease exacerbation:  The      patient seems to be improving with some nebulization in the      hospital.  She is not on optimal treatment at home.  We will admit      her; start her on albuterol and Atrovent nebulizers, IV steroids to      transition her to oral prednisone.  We will give her IV Levaquin      and oxygen.  Once the patient is more stable we will transition her      to a proper home regimen with long-acting bronchodilators.  2. Bilateral lower extremity edema:  The patient had some workup      during the last hospitalization.  Her 2-D echocardiogram back then      showed that she may have had diastolic dysfunction, because her EF      was 70%.  At this point, she has been advised to elevate the feet      and continue with her Lasix.  3. Hypertension:  Again, continue with her  Lasix; and if we need to,      we may change her medications during this hospitalization.      Otherwise, will continue with what she is taking at this point.      The patient has already quit smoking, so we will not put her on any      nicotine patch on her.  Further treatment will depend on how the      patient does this hospitalization.      Barbette Merino, M.D.  Electronically Signed     LG/MEDQ  D:  05/21/2009  T:  05/22/2009  Job:  [id number]   cc:   Sherrilee Gilles. Gerarda Fraction, MD  Fax: [phone]

## 2011-02-13 NOTE — Discharge Summary (Signed)
Cheyenne Case, GILHAM NO.:  [id number]   MEDICAL RECORD NO.:  [id number]          PATIENT TYPE:  INP   LOCATION:  A336                          FACILITY:  [facility]   PHYSICIAN:  Halford Chessman, M.D.  DATE OF BIRTH:  08-Jun-1941   DATE OF ADMISSION:  11/21/2006  DATE OF DISCHARGE:  02/27/2008LH                               DISCHARGE SUMMARY   DISCHARGE DIAGNOSES:  Bronchitis, chronic obstructive pulmonary disease  exacerbation.   HISTORY OF PRESENTING ILLNESS AND PAST MEDICAL HISTORY:  Please see  admission H&P.   HOSPITAL COURSE:  A 70 year old female smoker with COPD exacerbation and  bronchitis.  She was admitted for IV antibiotics and Avelox daily,  Xopenex and Atrovent nebulizers, and prednisone 50 mg daily.  We also  strongly encouraged her to quit smoking, which she commits to [place] that  she is going to do.   The patient improved on a daily basis and has remained afebrile.  O2  saturations after even one day came up in the mid-90s on room air, which  is great.  She has really improved greatly and is ready for discharge on  November 24, 2006.   DISCHARGE PHYSICAL EXAMINATION:  VITAL SIGNS:  She is afebrile, vital  signs stable.  GENERAL:  A pleasant, talkative female in no acute distress.  CHEST:  The chest is really completely clear today.  No wheezes, no  rhonchi, no rales.  CARDIOVASCULAR:  No murmurs.  ABDOMEN:  Soft.   DISCHARGE MEDICATIONS:  1. Prednisone taper, 40 mg for 3 days, 30 mg for 3 days, 20 mg for 3      days, 10 mg for 3 days.  2. Avelox 400 mg daily for another week.  3. Xopenex nebulizers q.8h.  4. Spiriva one inhalation daily.   She is going to not smoke.   FOLLOWUP:  She is going to follow up in the office in 1 week.   DISCHARGE CONDITION:  Improved and stable.     Halford Chessman, M.D.  Electronically Signed    JCG/MEDQ  D:  11/24/2006  T:  11/24/2006  Job:  [id number]

## 2011-02-13 NOTE — H&P (Signed)
NAMEROLANDE, MOE NO.:  [id number]   MEDICAL RECORD NO.:  [id number]          PATIENT TYPE:  INP   LOCATION:  Y101                          FACILITY:  [facility]   PHYSICIAN:  Halford Chessman, M.D.  DATE OF BIRTH:  06-04-1941   DATE OF ADMISSION:  11/21/2006  DATE OF DISCHARGE:  LH                              HISTORY & PHYSICAL   ADMITTING DIAGNOSIS:  Bronchitis, chronic obstructive pulmonary disease  exacerbation.   HISTORY OF PRESENT ILLNESS:  This is a 70 year old female with  longstanding history of smoking and COPD who presents with cough and  congestion.  She really has no other significant past medical history  and basically never comes into the doctor's office for any other  reasons.  She has had about a week or two of congestion, cough, low-  grade fevers.  She was recently seen by Dr. Orson Ape on February 22,  given Mucinex, albuterol, and a Depo-Medrol shot.  She has also been to  the ER on multiple occasions for this exacerbation of bronchitis.  Luckily, cardiovascular, GI, GU review of systems are all negative.  She  basically reports just continued cough and congestion and some low-grade  fevers.  She reports no acute shortness of breath.   PAST MEDICAL HISTORY:  Smoking.   PAST SURGICAL HISTORY:  None.   MEDICATIONS ON ADMISSION:  None.  No known drug allergies.   SOCIAL HISTORY:  Does smoke a pack a day for most of her life.   FAMILY HISTORY:  Heart disease and emphysema.   PHYSICAL EXAMINATION ON ADMISSION:  VITAL SIGNS:  Temperature of 99.0,  blood pressure was 130/72, respiratory rate is 24, pulse 72, weight is  110 pounds.  GENERAL:  When I saw the patient she was pleasant, talkative, sitting up  in bed in no acute distress.  HEENT: Nasopharynx clear with moist mucous membranes.  NECK:  Supple.  No lymphadenopathy.  CHEST:  There is just some mild end-expiratory wheezes with upper  rhonchi, no rales, no consolidation.  CARDIOVASCULAR:  Regular rate and rhythm with a normal S1, S2, no  murmurs.  ABDOMEN:  Soft, nontender.  EXTREMITIES:  No cyanosis or clubbing, no edema.   LABORATORY:  Please see flow sheet for details.  White count is  unremarkable.  Blood gas, again, overall unremarkable with a pH  compensated at 7.4, pCO2 was in the 40s as well as the pO2 on room air  was 50.  O2 saturation came up nicely to 98% on 2 L of O2 nasal cannula.  Chest x-ray reported COPD but no other acute findings.   ASSESSMENT/PLAN:  A 70 year old female smoker with chronic obstructive  pulmonary disease exacerbation and bronchitis.   PLAN:  1. Admit for close monitoring.  2. Aggressive IV antibiotics with Avelox 400 mg IV daily.  3. Xopenex plus Atrovent nebulizers q.i.d.  4. Albuterol nebulizers q.4h. p.r.n.  5. Prednisone 50 mg p.o. daily.  6. Smoking cessation classes.      Halford Chessman, M.D.  Electronically Signed     JCG/MEDQ  D:  11/22/2006  T:  11/22/2006  Job:  [id number]

## 2011-06-18 LAB — DIFFERENTIAL
Basophils Relative: 0
Lymphocytes Relative: 23
Lymphs Abs: 1.6
Monocytes Relative: 7
Neutro Abs: 4.8
Neutrophils Relative %: 68

## 2011-06-18 LAB — BASIC METABOLIC PANEL
Calcium: 9.5
Creatinine, Ser: 0.87
GFR calc Af Amer: >60
GFR calc non Af Amer: >60
Sodium: 139

## 2011-06-18 LAB — CBC
MCHC: 33.8
RBC: 4.19
WBC: 7.1

## 2011-10-01 ENCOUNTER — Other Ambulatory Visit (HOSPITAL_COMMUNITY): Payer: Self-pay | Admitting: Internal Medicine

## 2011-10-01 ENCOUNTER — Ambulatory Visit (HOSPITAL_COMMUNITY)
Admission: RE | Admit: 2011-10-01 | Discharge: 2011-10-01 | Disposition: A | Discharge status: Home / Self Care | Payer: Medicare Other | Source: Ambulatory Visit | Attending: Internal Medicine | Admitting: Internal Medicine

## 2011-10-01 DIAGNOSIS — L0291 Cutaneous abscess, unspecified: Secondary | ICD-10-CM | POA: Diagnosis not present

## 2011-10-01 DIAGNOSIS — Z6825 Body mass index (BMI) 25.0-25.9, adult: Secondary | ICD-10-CM | POA: Diagnosis not present

## 2011-10-01 DIAGNOSIS — M25569 Pain in unspecified knee: Secondary | ICD-10-CM

## 2011-10-01 DIAGNOSIS — R609 Edema, unspecified: Secondary | ICD-10-CM | POA: Diagnosis not present

## 2011-10-01 DIAGNOSIS — M7989 Other specified soft tissue disorders: Secondary | ICD-10-CM | POA: Insufficient documentation

## 2011-10-01 DIAGNOSIS — M79609 Pain in unspecified limb: Secondary | ICD-10-CM | POA: Insufficient documentation

## 2011-10-01 DIAGNOSIS — R599 Enlarged lymph nodes, unspecified: Secondary | ICD-10-CM | POA: Insufficient documentation

## 2011-10-01 DIAGNOSIS — L0390 Cellulitis, unspecified: Secondary | ICD-10-CM

## 2011-10-21 DIAGNOSIS — Z6825 Body mass index (BMI) 25.0-25.9, adult: Secondary | ICD-10-CM | POA: Diagnosis not present

## 2011-10-21 DIAGNOSIS — E039 Hypothyroidism, unspecified: Secondary | ICD-10-CM | POA: Diagnosis not present

## 2011-11-08 ENCOUNTER — Other Ambulatory Visit: Payer: Self-pay

## 2011-11-08 ENCOUNTER — Encounter (HOSPITAL_COMMUNITY): Payer: Self-pay

## 2011-11-08 ENCOUNTER — Inpatient Hospital Stay (HOSPITAL_COMMUNITY)
Admission: EM | Admit: 2011-11-08 | Discharge: 2011-11-09 | DRG: 192 | Disposition: A | Discharge status: Home Health | Payer: Medicare Other | Attending: Internal Medicine | Admitting: Internal Medicine

## 2011-11-08 ENCOUNTER — Emergency Department (HOSPITAL_COMMUNITY): Payer: Medicare Other

## 2011-11-08 DIAGNOSIS — D473 Essential (hemorrhagic) thrombocythemia: Secondary | ICD-10-CM | POA: Diagnosis present

## 2011-11-08 DIAGNOSIS — Z87891 Personal history of nicotine dependence: Secondary | ICD-10-CM

## 2011-11-08 DIAGNOSIS — R062 Wheezing: Secondary | ICD-10-CM

## 2011-11-08 DIAGNOSIS — R609 Edema, unspecified: Secondary | ICD-10-CM | POA: Diagnosis not present

## 2011-11-08 DIAGNOSIS — I2789 Other specified pulmonary heart diseases: Secondary | ICD-10-CM | POA: Diagnosis present

## 2011-11-08 DIAGNOSIS — J441 Chronic obstructive pulmonary disease with (acute) exacerbation: Principal | ICD-10-CM | POA: Diagnosis present

## 2011-11-08 DIAGNOSIS — Z79899 Other long term (current) drug therapy: Secondary | ICD-10-CM | POA: Diagnosis not present

## 2011-11-08 DIAGNOSIS — I10 Essential (primary) hypertension: Secondary | ICD-10-CM | POA: Diagnosis not present

## 2011-11-08 DIAGNOSIS — R918 Other nonspecific abnormal finding of lung field: Secondary | ICD-10-CM | POA: Diagnosis not present

## 2011-11-08 DIAGNOSIS — R0600 Dyspnea, unspecified: Secondary | ICD-10-CM

## 2011-11-08 DIAGNOSIS — J449 Chronic obstructive pulmonary disease, unspecified: Secondary | ICD-10-CM

## 2011-11-08 DIAGNOSIS — J4489 Other specified chronic obstructive pulmonary disease: Secondary | ICD-10-CM | POA: Diagnosis not present

## 2011-11-08 DIAGNOSIS — J438 Other emphysema: Secondary | ICD-10-CM | POA: Diagnosis not present

## 2011-11-08 DIAGNOSIS — R0602 Shortness of breath: Secondary | ICD-10-CM | POA: Diagnosis not present

## 2011-11-08 HISTORY — DX: Chronic obstructive pulmonary disease, unspecified: J44.9

## 2011-11-08 HISTORY — DX: Essential (primary) hypertension: I10

## 2011-11-08 LAB — COMPREHENSIVE METABOLIC PANEL
ALT: 8 U/L (ref 0–35)
CO2: 29 mEq/L (ref 19–32)
Calcium: 9.4 mg/dL (ref 8.4–10.5)
Creatinine, Ser: 1.34 mg/dL — ABNORMAL HIGH (ref 0.50–1.10)
GFR calc Af Amer: 45 mL/min — ABNORMAL LOW (ref >90)
GFR calc non Af Amer: 39 mL/min — ABNORMAL LOW (ref >90)
Glucose, Bld: 179 mg/dL — ABNORMAL HIGH (ref 70–99)
Sodium: 142 mEq/L (ref 135–145)
Total Bilirubin: 0.6 mg/dL (ref 0.3–1.2)

## 2011-11-08 LAB — CBC
Hemoglobin: 12 g/dL (ref 12.0–15.0)
MCV: 99.5 fL (ref 78.0–100.0)
Platelets: 441 10*3/uL — ABNORMAL HIGH (ref 150–400)
RBC: 3.85 MIL/uL — ABNORMAL LOW (ref 3.87–5.11)
WBC: 7.4 10*3/uL (ref 4.0–10.5)

## 2011-11-08 MED ORDER — ALBUTEROL SULFATE (5 MG/ML) 0.5% IN NEBU
5.0000 mg | INHALATION_SOLUTION | Freq: Once | RESPIRATORY_TRACT | Status: AC
  Administered 2011-11-08: 5 mg via RESPIRATORY_TRACT
  Filled 2011-11-08: qty 1

## 2011-11-08 MED ORDER — ALBUTEROL SULFATE (5 MG/ML) 0.5% IN NEBU
5.0000 mg | INHALATION_SOLUTION | Freq: Once | RESPIRATORY_TRACT | Status: DC
Start: 2011-11-08 — End: 2011-11-09

## 2011-11-08 MED ORDER — IPRATROPIUM BROMIDE 0.02 % IN SOLN: 0.5000 mg | Freq: Once | RESPIRATORY_TRACT | Status: DC

## 2011-11-08 MED ORDER — SODIUM CHLORIDE 0.9 % IV SOLN
INTRAVENOUS | Status: DC
  Administered 2011-11-08: 19:00:00 via INTRAVENOUS

## 2011-11-08 MED ORDER — IPRATROPIUM BROMIDE 0.02 % IN SOLN
0.5000 mg | Freq: Once | RESPIRATORY_TRACT | Status: AC
  Administered 2011-11-08: 0.5 mg via RESPIRATORY_TRACT
  Filled 2011-11-08: qty 2.5

## 2011-11-08 MED ORDER — METHYLPREDNISOLONE SODIUM SUCC 125 MG IJ SOLR
125.0000 mg | Freq: Once | INTRAMUSCULAR | Status: AC
  Administered 2011-11-08: 125 mg via INTRAVENOUS
  Filled 2011-11-08: qty 2

## 2011-11-08 NOTE — ED Provider Notes (Addendum)
History     CSN: [id number]  Arrival date & time 11/08/11  1745   First MD Initiated Contact with Patient 11/08/11 1758      Chief Complaint  Patient presents with  . Shortness of Breath    (Consider location/radiation/quality/duration/timing/severity/associated sxs/prior treatment) Patient is a 71 y.o. female presenting with shortness of breath. The history is provided by the patient.  Shortness of Breath  Associated symptoms include cough and shortness of breath. Pertinent negatives include no chest pain and no fever.  pt w hx copd, presents w progressive sob, wheezing x 1 week. Using home mdi/neb regularly but without significant improvement. Worse coughing spells. No orthopnea or pnd. No current or recent cp. States in past week has had recent 'cold', w non productive cough, nasal congestion/rhinorrhea. No sore throat. No body aches. No fever or chills. Similar symptoms in past related to copd. No home o2 hx. No dvt or pe hx.     Past Medical History  Diagnosis Date  . Emphysema   . Hypertension   . COPD (chronic obstructive pulmonary disease)     History reviewed. No pertinent past surgical history.  No family history on file.  History  Substance Use Topics  . Smoking status: Former Research scientist (life sciences)  . Smokeless tobacco: Not on file  . Alcohol Use: No    OB History    Grav Para Term Preterm Abortions TAB SAB Ect Mult Living                  Review of Systems  Constitutional: Negative for fever and chills.  HENT: Negative for neck pain.   Eyes: Negative for redness.  Respiratory: Positive for cough and shortness of breath.   Cardiovascular: Negative for chest pain.  Gastrointestinal: Negative for abdominal pain.  Genitourinary: Negative for flank pain.  Musculoskeletal: Negative for back pain.  Skin: Negative for rash.  Neurological: Negative for headaches.  Hematological: Does not bruise/bleed easily.  Psychiatric/Behavioral: Negative for confusion.     Allergies  Review of patient's allergies indicates no known allergies.  Home Medications  No current outpatient prescriptions on file.  BP 148/58  Pulse 114  Temp(Src) 97.4 F (36.3 C) (Oral)  Resp 25  Ht _0  (1.575 m)  Wt 140 lb (63.504 kg)  BMI 25.61 kg/m2  SpO2 90%  Physical Exam  Nursing note and vitals reviewed. Constitutional: She is oriented to person, place, and time. She appears well-developed and well-nourished.  HENT:  Mouth/Throat: Oropharynx is clear and moist.  Eyes: Conjunctivae are normal. No scleral icterus.  Neck: Neck supple. No tracheal deviation present.  Cardiovascular: Regular rhythm, normal heart sounds and intact distal pulses.  Exam reveals no gallop and no friction rub.   No murmur heard. Pulmonary/Chest: She is in respiratory distress. She has wheezes. She exhibits no tenderness.       Decreased bs bil   Abdominal: Soft. Normal appearance. She exhibits no distension. There is no tenderness.  Musculoskeletal: She exhibits no tenderness.       Left lower leg swollen as compared to right. (pt states recent left leg cellulitis, states finished abx but swelling improved, but not yet resolved, states had doppler of leg neg for dvt).   Neurological: She is alert and oriented to person, place, and time.  Skin: Skin is warm and dry. No rash noted.  Psychiatric: She has a normal mood and affect.    ED Course  Procedures (including critical care time)  Results for orders placed during  the hospital encounter of 11/08/11  CBC      Component Value Range   WBC 7.4  4.0 - 10.5 (K/uL)   RBC 3.85 (*) 3.87 - 5.11 (MIL/uL)   Hemoglobin 12.0  12.0 - 15.0 (g/dL)   HCT 38.3  36.0 - 46.0 (%)   MCV 99.5  78.0 - 100.0 (fL)   MCH 31.2  26.0 - 34.0 (pg)   MCHC 31.3  30.0 - 36.0 (g/dL)   RDW 12.9  11.5 - 15.5 (%)   Platelets 441 (*) 150 - 400 (K/uL)  COMPREHENSIVE METABOLIC PANEL      Component Value Range   Sodium 142  135 - 145 (mEq/L)   Potassium 3.9   3.5 - 5.1 (mEq/L)   Chloride 102  96 - 112 (mEq/L)   CO2 29  19 - 32 (mEq/L)   Glucose, Bld 179 (*) 70 - 99 (mg/dL)   BUN 22  6 - 23 (mg/dL)   Creatinine, Ser 1.34 (*) 0.50 - 1.10 (mg/dL)   Calcium 9.4  8.4 - 10.5 (mg/dL)   Total Protein 7.4  6.0 - 8.3 (g/dL)   Albumin 3.3 (*) 3.5 - 5.2 (g/dL)   AST 17  0 - 37 (U/L)   ALT 8  0 - 35 (U/L)   Alkaline Phosphatase 107  39 - 117 (U/L)   Total Bilirubin 0.6  0.3 - 1.2 (mg/dL)   GFR calc non Af Amer 39 (*) >90 (mL/min)   GFR calc Af Amer 45 (*) >90 (mL/min)  POCT I-STAT TROPONIN I      Component Value Range   Troponin i, poc 0.00  0.00 - 0.08 (ng/mL)   Comment 3            Dg Chest 2 View  11/08/2011  *RADIOLOGY REPORT*  Clinical Data: Shortness of breath and cough.  CHEST - 2 VIEW  Comparison: Portable chest 10/05/2009.  Findings: The heart size is normal.  Emphysematous changes are again noted.  No focal airspace disease is present.  Chronic calcifications in the left breast are stable.  IMPRESSION:  1.  Emphysema. 2.  No acute cardiopulmonary disease or significant interval change.  Original Report Authenticated By: Resa Miner. MATTERN, M.D.      MDM  Iv ns. Solumedrol iv. o2 2 l [place]. Alb and atrovent neb. Reviewed prior charts and nursing notes.     Date: 11/08/2011  Rate: 108  Rhythm: sinus tachycardia  QRS Axis: normal  Intervals: normal  ST/T Wave abnormalities: nonspecific ST changes  Conduction Disutrbances:none  Narrative Interpretation:   Old EKG Reviewed: changes noted   Recheck persistent wheezing. Additional albuterol and atrovent nebs.  w additional nebs, improved air exchange, but remains dyspneic, wheezing.   Pulse ox 90% room air.   Will admit re copd exacerbation.       Mirna Mires, MD 11/17/11 [place], MD 11/17/11 [phone]

## 2011-11-08 NOTE — ED Notes (Signed)
Pt presents with SOB x 2 hours. Pt states "my heart is pounding". HR 114. Pt took home breathing tx but no relief.

## 2011-11-08 NOTE — H&P (Signed)
PCP:  Leonides Grills, MD, MD Chief Complaint:  Shortness of breath of one week's duration.  HPI:  Patient is a 71 year old Caucasian female with history of COPD, hypertension presenting to the emergency room with one week history of shortness breath and this was said to be getting progressively worse. Associated with shortness of breath is cough that is not productive of sputum. Patient denies any history of chest pain. She denies any palpitation. She claimed she was nauseated but denied any vomiting. Patient also denies any history of PND or orthopnea. She denies any fever, chills or Rigors. No history of abdominal discomfort. No diarrhea or hematochezia. She complained of progressive swelling of the lower extremity. In the emergency room patient was given breathing treatment and she felt better. After evaluation, patient was advised to be admitted for stabilization  Review of Systems:  The patient denies anorexia, fever, weight loss,, vision loss, decreased hearing, hoarseness, chest pain, syncope, dyspnea on exertion, peripheral edema++, balance deficits, shortness of breath with associated cough that is nonproductive of sputum++, abdominal pain, melena, hematochezia, severe indigestion/heartburn, hematuria, incontinence, genital sores, muscle weakness, suspicious skin lesions, transient blindness, difficulty walking, depression, unusual weight change, abnormal bleeding, enlarged lymph nodes, angioedema, and breast masses.  Past Medical History:  Past Medical History  Diagnosis Date  . Emphysema   . Hypertension   . COPD (chronic obstructive pulmonary disease)     History reviewed. No pertinent past surgical history.  Medications:  Prior to Admission medications   Medication Sig Start Date End Date Taking? Authorizing Provider  furosemide (LASIX) 20 MG tablet Take 20 mg by mouth 2 (two) times daily.   Yes Historical Provider, MD  ipratropium (ATROVENT HFA) 17 MCG/ACT inhaler  Inhale 2 puffs into the lungs every 6 (six) hours. Shortness of breath   Yes Historical Provider, MD  ipratropium-albuterol (DUONEB) 0.5-2.5 (3) MG/3ML SOLN Take 3 mLs by nebulization 4 (four) times daily.   Yes Historical Provider, MD  LISINOPRIL PO Take 2 tablets by mouth daily. Patient can not remember strength   Yes Historical Provider, MD    Allergies:  No Known Allergies  Social History:   reports that she has quit smoking. She does not have any smokeless tobacco history on file. She reports that she does not drink alcohol or use illicit drugs.  Family History:  No family history on file.  Physical Exam:  Filed Vitals:   11/08/11 1844 11/08/11 1912 11/08/11 2042 11/08/11 2129  BP:  129/44  123/57  Pulse:      Temp:    97.3 F (36.3 C)  TempSrc:    Oral  Resp:  20  18  Height:      Weight:      SpO2: 100% 100% 100% 99%      General: Alert and oriented times three, well developed and nourished, not in acute distress  Eyes: PERRLA, pink conjunctiva, scleral icterus  ENT: Moist oral mucosa, neck supple, no thyromegaly  Lungs: Rhonchi all over the lung fields. No Rales  Cardiovascular: regular rate and rhythm, no regurgitation, no gallops, no murmurs. No carotid bruits, no JVD  Abdomen: soft, positive BS, non-tender, non-distended, no organomegaly, not an acute abdomen  GU: not examined  Neuro: No lateralizing signs  Musculoskeletal: strength 5/5 all extremities, no clubbing, cyanosis, +1 pedal  edema  Skin: no rash, no subcutaneous crepitation, no decubitus  Psych: appropriate affect  ?  Labs on Admission:   [facility] 11/08/11 1818  NA 142  K 3.9  CL 102  CO2 29  GLUCOSE 179*  BUN 22  CREATININE 1.34*  CALCIUM 9.4  MG --  PHOS --     Basename 11/08/11 1818  AST 17  ALT 8  ALKPHOS 107  BILITOT 0.6  PROT 7.4  ALBUMIN 3.3*    No results found for this basename: LIPASE:2,AMYLASE:2 in the last 72 hours   Basename 11/08/11 1818  WBC  7.4  NEUTROABS --  HGB 12.0  HCT 38.3  MCV 99.5  PLT 441*    No results found for this basename: CKTOTAL:3,CKMB:3,CKMBINDEX:3,TROPONINI:3 in the last 72 hours  No results found for this basename: TSH,T4TOTAL,FREET3,T3FREE,THYROIDAB in the last 72 hours  No results found for this basename: VITAMINB12:2,FOLATE:2,FERRITIN:2,TIBC:2,IRON:2,RETICCTPCT:2 in the last 72 hours  Radiological Exams on Admission:  Dg Chest 2 View  11/08/2011  *RADIOLOGY REPORT*  Clinical Data: Shortness of breath and cough.  CHEST - 2 VIEW  Comparison: Portable chest 10/05/2009.  Findings: The heart size is normal.  Emphysematous changes are again noted.  No focal airspace disease is present.  Chronic calcifications in the left breast are stable.  IMPRESSION:  1.  Emphysema. 2.  No acute cardiopulmonary disease or significant interval change.  Original Report Authenticated By: Resa Miner. MATTERN, M.D.    Assessment/Plan  Present on Admission:   Problems: #1 shortness of breath #2 cough-not productive of sputum #3 pedal edema.  Impression: #1 COPD exacerbation #2 questionable cor pulmonale/pulmonary hypertension #3 hypertension #4 thrombocytosis  Plan: #1 admit patient to telemetry #2 start nebulizer treatment i.e. Albuterol and Atrovent,  IV Solu-Medrol and Levaquin #3 maintain blood pressure with lisinopril #4 GI prophylaxis with Protonix and DVT prophylaxis with Lovenox #5 labs; cardiac enzymes every 8x3, proBNP, CBC CMP and magnesium repeated in a.m. #6 imaging-2-D echo Patient will be evaluated on daily basis        Jackie Plum             [phone]

## 2011-11-09 ENCOUNTER — Encounter (HOSPITAL_COMMUNITY): Payer: Self-pay

## 2011-11-09 LAB — DIFFERENTIAL
Basophils Relative: 0 % (ref 0–1)
Lymphocytes Relative: 10 % — ABNORMAL LOW (ref 12–46)
Lymphs Abs: 0.6 10*3/uL — ABNORMAL LOW (ref 0.7–4.0)
Monocytes Absolute: 0.1 10*3/uL (ref 0.1–1.0)
Monocytes Relative: 1 % — ABNORMAL LOW (ref 3–12)
Neutro Abs: 5.3 10*3/uL (ref 1.7–7.7)

## 2011-11-09 LAB — COMPREHENSIVE METABOLIC PANEL
Alkaline Phosphatase: 101 U/L (ref 39–117)
BUN: 22 mg/dL (ref 6–23)
CO2: 29 mEq/L (ref 19–32)
Chloride: 102 mEq/L (ref 96–112)
Creatinine, Ser: 1.3 mg/dL — ABNORMAL HIGH (ref 0.50–1.10)
GFR calc non Af Amer: 40 mL/min — ABNORMAL LOW (ref >90)
Glucose, Bld: 161 mg/dL — ABNORMAL HIGH (ref 70–99)
Potassium: 4.3 mEq/L (ref 3.5–5.1)
Total Bilirubin: 0.4 mg/dL (ref 0.3–1.2)

## 2011-11-09 LAB — CBC
HCT: 35.6 % — ABNORMAL LOW (ref 36.0–46.0)
Hemoglobin: 11.3 g/dL — ABNORMAL LOW (ref 12.0–15.0)
MCHC: 31.7 g/dL (ref 30.0–36.0)
RBC: 3.63 MIL/uL — ABNORMAL LOW (ref 3.87–5.11)

## 2011-11-09 LAB — PRO B NATRIURETIC PEPTIDE: Pro B Natriuretic peptide (BNP): 264.9 pg/mL — ABNORMAL HIGH (ref 0–125)

## 2011-11-09 LAB — CARDIAC PANEL(CRET KIN+CKTOT+MB+TROPI): Total CK: 126 U/L (ref 7–177)

## 2011-11-09 LAB — MAGNESIUM: Magnesium: 2 mg/dL (ref 1.5–2.5)

## 2011-11-09 MED ORDER — ALBUTEROL SULFATE (5 MG/ML) 0.5% IN NEBU
2.5000 mg | INHALATION_SOLUTION | RESPIRATORY_TRACT | Status: DC
  Administered 2011-11-09 (×2): 2.5 mg via RESPIRATORY_TRACT
  Filled 2011-11-09 (×3): qty 0.5

## 2011-11-09 MED ORDER — LISINOPRIL 10 MG PO TABS: 10.0000 mg | ORAL_TABLET | Freq: Every day | ORAL | Status: DC

## 2011-11-09 MED ORDER — ENOXAPARIN SODIUM 40 MG/0.4ML ~~LOC~~ SOLN: 30.0000 mg | Freq: Every day | SUBCUTANEOUS | Status: DC

## 2011-11-09 MED ORDER — IPRATROPIUM BROMIDE 0.02 % IN SOLN
0.5000 mg | RESPIRATORY_TRACT | Status: DC
  Administered 2011-11-09 (×2): 0.5 mg via RESPIRATORY_TRACT
  Filled 2011-11-09 (×3): qty 2.5

## 2011-11-09 MED ORDER — METHYLPREDNISOLONE SODIUM SUCC 125 MG IJ SOLR
60.0000 mg | Freq: Two times a day (BID) | INTRAMUSCULAR | Status: DC
  Administered 2011-11-09: 60 mg via INTRAVENOUS
  Filled 2011-11-09: qty 2

## 2011-11-09 MED ORDER — PREDNISONE 20 MG PO TABS: ORAL_TABLET | ORAL | Status: DC

## 2011-11-09 MED ORDER — ALBUTEROL SULFATE (5 MG/ML) 0.5% IN NEBU: 2.5000 mg | INHALATION_SOLUTION | RESPIRATORY_TRACT | Status: DC | PRN

## 2011-11-09 MED ORDER — FUROSEMIDE 10 MG/ML IJ SOLN
40.0000 mg | Freq: Two times a day (BID) | INTRAMUSCULAR | Status: DC
  Administered 2011-11-09: 40 mg via INTRAVENOUS
  Filled 2011-11-09 (×2): qty 4

## 2011-11-09 MED ORDER — LEVOFLOXACIN IN D5W 500 MG/100ML IV SOLN
INTRAVENOUS | Status: AC
  Filled 2011-11-09: qty 100

## 2011-11-09 MED ORDER — SODIUM CHLORIDE 0.9 % IJ SOLN
INTRAMUSCULAR | Status: AC
  Filled 2011-11-09: qty 6

## 2011-11-09 MED ORDER — FUROSEMIDE 10 MG/ML IJ SOLN
40.0000 mg | Freq: Once | INTRAMUSCULAR | Status: AC
  Administered 2011-11-09: 40 mg via INTRAVENOUS

## 2011-11-09 MED ORDER — LEVOFLOXACIN 500 MG PO TABS: 500.0000 mg | ORAL_TABLET | Freq: Every day | ORAL | Status: AC

## 2011-11-09 MED ORDER — LEVOFLOXACIN IN D5W 500 MG/100ML IV SOLN
500.0000 mg | Freq: Every day | INTRAVENOUS | Status: DC
  Administered 2011-11-09: 500 mg via INTRAVENOUS
  Filled 2011-11-09: qty 100

## 2011-11-09 NOTE — Discharge Summary (Signed)
Physician Discharge Summary  Patient ID: SHAUNTELL IGLESIA MRN: [id number] DOB/AGE: 1941/09/02 71 y.o. Primary Care Physician:MCGOUGH,WILLIAM M, MD, MD Admit date: 11/08/2011 Discharge date: 11/09/2011    Discharge Diagnoses:  1. Exacerbation of COPD.   Medication List  As of 11/09/2011  8:26 AM   TAKE these medications         furosemide 20 MG tablet   Commonly known as: LASIX   Take 20 mg by mouth 2 (two) times daily.      ipratropium 17 MCG/ACT inhaler   Commonly known as: ATROVENT HFA   Inhale 2 puffs into the lungs every 6 (six) hours. Shortness of breath      ipratropium-albuterol 0.5-2.5 (3) MG/3ML Soln   Commonly known as: DUONEB   Take 3 mLs by nebulization 4 (four) times daily.      levofloxacin 500 MG tablet   Commonly known as: LEVAQUIN   Take 1 tablet (500 mg total) by mouth daily.      LISINOPRIL PO   Take 2 tablets by mouth daily. Patient can not remember strength      predniSONE 20 MG tablet   Commonly known as: DELTASONE   Take 2 tablets daily for 3 days, then 1 tablet daily for 3 days, then half tablet daily for 3 days, then STOP.            Discharged Condition: Stable and improved.    Consults: None.  Significant Diagnostic Studies: Dg Chest 2 View  11/08/2011  *RADIOLOGY REPORT*  Clinical Data: Shortness of breath and cough.  CHEST - 2 VIEW  Comparison: Portable chest 10/05/2009.  Findings: The heart size is normal.  Emphysematous changes are again noted.  No focal airspace disease is present.  Chronic calcifications in the left breast are stable.  IMPRESSION:  1.  Emphysema. 2.  No acute cardiopulmonary disease or significant interval change.  Original Report Authenticated By: Resa Miner. MATTERN, M.D.    Lab Results: Basic Metabolic Panel:  Basename 11/09/11 0255 11/08/11 1818  NA 142 142  K 4.3 3.9  CL 102 102  CO2 29 29  GLUCOSE 161* 179*  BUN 22 22  CREATININE 1.30* 1.34*  CALCIUM 8.9 9.4  MG 2.0 --  PHOS -- --   Liver  Function Tests:  [facility] 11/09/11 0255 11/08/11 1818  AST 16 17  ALT 7 8  ALKPHOS 101 107  BILITOT 0.4 0.6  PROT 7.0 7.4  ALBUMIN 3.1* 3.3*     CBC:  Basename 11/09/11 0255 11/08/11 1818  WBC 6.0 7.4  NEUTROABS 5.3 --  HGB 11.3* 12.0  HCT 35.6* 38.3  MCV 98.1 99.5  PLT 421* 441*       Hospital Course: This very pleasant 71 year old lady was admitted yesterday with symptoms of dyspnea and a productive cough for approximately one week. The productive cough with whitish in color. She denies any fever with the symptoms. She feels much improved overnight having received steroids intravenously and antibiotics. She think she is back to her baseline. She does not receive any home oxygen. Her chest x-ray did not show any evidence of pneumonia.  Discharge Exam: Blood pressure 109/63, pulse 86, temperature 98.1 F (36.7 C), temperature source Oral, resp. rate 20, height _0  (1.575 m), weight 63.504 kg (140 lb), SpO2 95.00%. She looks systemically well. There is no increased work of breathing. There is no peripheral central cyanosis. Lung fields very clear with just a few scattered wheezing areas. There is no crackles, bronchial breathing.  Heart sounds are present and normal and appear to be in sinus rhythm. Jugular venous pressure is not raised. There is no real evidence of congestive heart failure. She is alert and orientated. There are no focal neurological signs. Abdomen is soft without any evidence of hepatomegaly.  Disposition: Home. She will need a tapering course of prednisone as well as antibiotics. She will follow with her primary care physician in approximately one week's time.  Discharge Orders    Future Orders Please Complete By Expires   Diet - low sodium heart healthy      Increase activity slowly         Follow-up Information    Follow up with Leonides Grills, MD .         SignedDoree Albee Pager [phone]  11/09/2011, 8:26 AM

## 2011-11-09 NOTE — Progress Notes (Signed)
CARE MANAGEMENT NOTE 11/09/2011  Patient:  Cheyenne Case, Cheyenne Case   Account Number:  [id number]  Date Initiated:  11/09/2011  Documentation initiated by:  Claretha Cooper  Subjective/Objective Assessment:   Pt admitted with emphasema. PTA lived with family at home     Action/Plan:   DC for [facility] and DME.   Anticipated DC Date:  11/09/2011   Anticipated DC Plan:  [place]  CM consult      Choice offered to / List presented to:     DME arranged  Vassie Moselle      DME agency  [place] arranged  HH-1 RN  [place]   Status of service:  Completed, signed off Medicare Important Message given?   (If response is "NO", the following Medicare IM given date fields will be blank) Date Medicare IM given:   Date Additional Medicare IM given:    Discharge Disposition:  [place]  Per UR Regulation:    Comments:  11/09/11 [place] BSN

## 2011-11-10 DIAGNOSIS — I10 Essential (primary) hypertension: Secondary | ICD-10-CM | POA: Diagnosis not present

## 2011-11-10 DIAGNOSIS — J441 Chronic obstructive pulmonary disease with (acute) exacerbation: Secondary | ICD-10-CM | POA: Diagnosis not present

## 2011-11-11 DIAGNOSIS — I10 Essential (primary) hypertension: Secondary | ICD-10-CM | POA: Diagnosis not present

## 2011-11-11 DIAGNOSIS — J441 Chronic obstructive pulmonary disease with (acute) exacerbation: Secondary | ICD-10-CM | POA: Diagnosis not present

## 2011-11-12 DIAGNOSIS — I10 Essential (primary) hypertension: Secondary | ICD-10-CM | POA: Diagnosis not present

## 2011-11-12 DIAGNOSIS — J441 Chronic obstructive pulmonary disease with (acute) exacerbation: Secondary | ICD-10-CM | POA: Diagnosis not present

## 2011-11-16 DIAGNOSIS — I10 Essential (primary) hypertension: Secondary | ICD-10-CM | POA: Diagnosis not present

## 2011-11-16 DIAGNOSIS — J441 Chronic obstructive pulmonary disease with (acute) exacerbation: Secondary | ICD-10-CM | POA: Diagnosis not present

## 2011-11-17 NOTE — ED Notes (Deleted)
a  Mirna Mires, MD 11/17/11 [phone]

## 2011-11-17 NOTE — ED Provider Notes (Deleted)
Unable to remove from [place], MD 11/17/11 [phone]

## 2011-11-18 DIAGNOSIS — R05 Cough: Secondary | ICD-10-CM | POA: Diagnosis not present

## 2011-11-18 DIAGNOSIS — J069 Acute upper respiratory infection, unspecified: Secondary | ICD-10-CM | POA: Diagnosis not present

## 2011-11-20 DIAGNOSIS — I10 Essential (primary) hypertension: Secondary | ICD-10-CM | POA: Diagnosis not present

## 2011-11-20 DIAGNOSIS — J441 Chronic obstructive pulmonary disease with (acute) exacerbation: Secondary | ICD-10-CM | POA: Diagnosis not present

## 2011-11-24 DIAGNOSIS — I10 Essential (primary) hypertension: Secondary | ICD-10-CM | POA: Diagnosis not present

## 2011-11-24 DIAGNOSIS — J441 Chronic obstructive pulmonary disease with (acute) exacerbation: Secondary | ICD-10-CM | POA: Diagnosis not present

## 2011-11-27 DIAGNOSIS — N189 Chronic kidney disease, unspecified: Secondary | ICD-10-CM

## 2011-11-27 HISTORY — DX: Chronic kidney disease, unspecified: N18.9

## 2011-12-19 ENCOUNTER — Encounter (HOSPITAL_COMMUNITY): Payer: Self-pay | Admitting: Emergency Medicine

## 2011-12-19 ENCOUNTER — Emergency Department (HOSPITAL_COMMUNITY): Payer: Medicare Other

## 2011-12-19 ENCOUNTER — Other Ambulatory Visit: Payer: Self-pay

## 2011-12-19 ENCOUNTER — Inpatient Hospital Stay (HOSPITAL_COMMUNITY)
Admission: EM | Admit: 2011-12-19 | Discharge: 2011-12-25 | DRG: 191 | Disposition: A | Discharge status: Skilled Nursing Facility | Payer: Medicare Other | Attending: Internal Medicine | Admitting: Internal Medicine

## 2011-12-19 DIAGNOSIS — J209 Acute bronchitis, unspecified: Secondary | ICD-10-CM | POA: Diagnosis not present

## 2011-12-19 DIAGNOSIS — E860 Dehydration: Secondary | ICD-10-CM

## 2011-12-19 DIAGNOSIS — J438 Other emphysema: Secondary | ICD-10-CM | POA: Diagnosis not present

## 2011-12-19 DIAGNOSIS — E559 Vitamin D deficiency, unspecified: Secondary | ICD-10-CM | POA: Diagnosis present

## 2011-12-19 DIAGNOSIS — R059 Cough, unspecified: Secondary | ICD-10-CM | POA: Diagnosis not present

## 2011-12-19 DIAGNOSIS — R197 Diarrhea, unspecified: Secondary | ICD-10-CM

## 2011-12-19 DIAGNOSIS — R5381 Other malaise: Secondary | ICD-10-CM | POA: Diagnosis not present

## 2011-12-19 DIAGNOSIS — E875 Hyperkalemia: Secondary | ICD-10-CM | POA: Diagnosis present

## 2011-12-19 DIAGNOSIS — N289 Disorder of kidney and ureter, unspecified: Secondary | ICD-10-CM

## 2011-12-19 DIAGNOSIS — J9600 Acute respiratory failure, unspecified whether with hypoxia or hypercapnia: Secondary | ICD-10-CM | POA: Diagnosis not present

## 2011-12-19 DIAGNOSIS — I2723 Pulmonary hypertension due to lung diseases and hypoxia: Secondary | ICD-10-CM

## 2011-12-19 DIAGNOSIS — J440 Chronic obstructive pulmonary disease with acute lower respiratory infection: Principal | ICD-10-CM | POA: Diagnosis present

## 2011-12-19 DIAGNOSIS — J449 Chronic obstructive pulmonary disease, unspecified: Secondary | ICD-10-CM

## 2011-12-19 DIAGNOSIS — G250 Essential tremor: Secondary | ICD-10-CM | POA: Diagnosis present

## 2011-12-19 DIAGNOSIS — R630 Anorexia: Secondary | ICD-10-CM | POA: Diagnosis not present

## 2011-12-19 DIAGNOSIS — J4489 Other specified chronic obstructive pulmonary disease: Secondary | ICD-10-CM | POA: Diagnosis not present

## 2011-12-19 DIAGNOSIS — N179 Acute kidney failure, unspecified: Secondary | ICD-10-CM | POA: Diagnosis present

## 2011-12-19 DIAGNOSIS — Z5189 Encounter for other specified aftercare: Secondary | ICD-10-CM | POA: Diagnosis not present

## 2011-12-19 DIAGNOSIS — E669 Obesity, unspecified: Secondary | ICD-10-CM | POA: Diagnosis present

## 2011-12-19 DIAGNOSIS — I2789 Other specified pulmonary heart diseases: Secondary | ICD-10-CM | POA: Diagnosis present

## 2011-12-19 DIAGNOSIS — J441 Chronic obstructive pulmonary disease with (acute) exacerbation: Secondary | ICD-10-CM | POA: Diagnosis not present

## 2011-12-19 DIAGNOSIS — E8779 Other fluid overload: Secondary | ICD-10-CM | POA: Diagnosis not present

## 2011-12-19 DIAGNOSIS — E8770 Fluid overload, unspecified: Secondary | ICD-10-CM

## 2011-12-19 DIAGNOSIS — D649 Anemia, unspecified: Secondary | ICD-10-CM

## 2011-12-19 DIAGNOSIS — G252 Other specified forms of tremor: Secondary | ICD-10-CM | POA: Diagnosis present

## 2011-12-19 DIAGNOSIS — I10 Essential (primary) hypertension: Secondary | ICD-10-CM

## 2011-12-19 DIAGNOSIS — J9819 Other pulmonary collapse: Secondary | ICD-10-CM | POA: Diagnosis not present

## 2011-12-19 DIAGNOSIS — T380X5A Adverse effect of glucocorticoids and synthetic analogues, initial encounter: Secondary | ICD-10-CM | POA: Diagnosis present

## 2011-12-19 DIAGNOSIS — R0602 Shortness of breath: Secondary | ICD-10-CM | POA: Diagnosis not present

## 2011-12-19 DIAGNOSIS — R531 Weakness: Secondary | ICD-10-CM

## 2011-12-19 DIAGNOSIS — I517 Cardiomegaly: Secondary | ICD-10-CM | POA: Diagnosis not present

## 2011-12-19 DIAGNOSIS — R05 Cough: Secondary | ICD-10-CM | POA: Diagnosis not present

## 2011-12-19 HISTORY — DX: Chronic kidney disease, unspecified: N18.9

## 2011-12-19 HISTORY — DX: Other malaise: R53.81

## 2011-12-19 LAB — BASIC METABOLIC PANEL
BUN: 38 mg/dL — ABNORMAL HIGH (ref 6–23)
CO2: 23 mEq/L (ref 19–32)
Chloride: 99 mEq/L (ref 96–112)
GFR calc non Af Amer: 35 mL/min — ABNORMAL LOW (ref >90)
Glucose, Bld: 115 mg/dL — ABNORMAL HIGH (ref 70–99)
Potassium: 4.2 mEq/L (ref 3.5–5.1)
Sodium: 135 mEq/L (ref 135–145)

## 2011-12-19 LAB — CBC
HCT: 35.3 % — ABNORMAL LOW (ref 36.0–46.0)
Hemoglobin: 11.5 g/dL — ABNORMAL LOW (ref 12.0–15.0)
MCH: 30.8 pg (ref 26.0–34.0)
MCHC: 32.6 g/dL (ref 30.0–36.0)
RBC: 3.73 MIL/uL — ABNORMAL LOW (ref 3.87–5.11)

## 2011-12-19 LAB — DIFFERENTIAL
Basophils Relative: 0 % (ref 0–1)
Eosinophils Absolute: 0 10*3/uL (ref 0.0–0.7)
Eosinophils Relative: 1 % (ref 0–5)
Lymphocytes Relative: 29 % (ref 12–46)
Neutro Abs: 3 10*3/uL (ref 1.7–7.7)

## 2011-12-19 LAB — PRO B NATRIURETIC PEPTIDE: Pro B Natriuretic peptide (BNP): 379.5 pg/mL — ABNORMAL HIGH (ref 0–125)

## 2011-12-19 MED ORDER — SODIUM CHLORIDE 0.9 % IJ SOLN
INTRAMUSCULAR | Status: AC
  Filled 2011-12-19: qty 3

## 2011-12-19 MED ORDER — BUDESONIDE-FORMOTEROL FUMARATE 160-4.5 MCG/ACT IN AERO
INHALATION_SPRAY | RESPIRATORY_TRACT | Status: AC
  Filled 2011-12-19: qty 6

## 2011-12-19 MED ORDER — ALBUTEROL SULFATE (5 MG/ML) 0.5% IN NEBU
5.0000 mg | INHALATION_SOLUTION | Freq: Once | RESPIRATORY_TRACT | Status: AC
  Administered 2011-12-19: 5 mg via RESPIRATORY_TRACT
  Filled 2011-12-19: qty 1

## 2011-12-19 MED ORDER — ONDANSETRON HCL 4 MG/2ML IJ SOLN
4.0000 mg | Freq: Once | INTRAMUSCULAR | Status: AC
  Administered 2011-12-19: 4 mg via INTRAVENOUS
  Filled 2011-12-19: qty 2

## 2011-12-19 MED ORDER — BUDESONIDE-FORMOTEROL FUMARATE 160-4.5 MCG/ACT IN AERO
2.0000 | INHALATION_SPRAY | Freq: Two times a day (BID) | RESPIRATORY_TRACT | Status: DC
  Administered 2011-12-19 – 2011-12-25 (×12): 2 via RESPIRATORY_TRACT
  Filled 2011-12-19: qty 6

## 2011-12-19 MED ORDER — SODIUM CHLORIDE 0.9 % IV BOLUS (SEPSIS)
500.0000 mL | Freq: Once | INTRAVENOUS | Status: AC
  Administered 2011-12-19: 500 mL via INTRAVENOUS

## 2011-12-19 MED ORDER — LOPERAMIDE HCL 2 MG PO CAPS
2.0000 mg | ORAL_CAPSULE | Freq: Once | ORAL | Status: AC
  Administered 2011-12-19: 2 mg via ORAL
  Filled 2011-12-19: qty 1

## 2011-12-19 MED ORDER — IPRATROPIUM BROMIDE 0.02 % IN SOLN
500.0000 ug | Freq: Four times a day (QID) | RESPIRATORY_TRACT | Status: DC
  Administered 2011-12-19 – 2011-12-25 (×26): 500 ug via RESPIRATORY_TRACT
  Filled 2011-12-19 (×24): qty 2.5

## 2011-12-19 MED ORDER — LEVOFLOXACIN IN D5W 500 MG/100ML IV SOLN
500.0000 mg | INTRAVENOUS | Status: DC
  Administered 2011-12-19: 500 mg via INTRAVENOUS
  Filled 2011-12-19 (×2): qty 100

## 2011-12-19 MED ORDER — SODIUM CHLORIDE 0.9 % IV SOLN
INTRAVENOUS | Status: DC
  Administered 2011-12-19: 18:00:00 via INTRAVENOUS
  Administered 2011-12-20: 1000 mL via INTRAVENOUS

## 2011-12-19 MED ORDER — ONDANSETRON HCL 4 MG PO TABS: 4.0000 mg | ORAL_TABLET | Freq: Four times a day (QID) | ORAL | Status: DC | PRN

## 2011-12-19 MED ORDER — ACETAMINOPHEN 650 MG RE SUPP: 650.0000 mg | Freq: Four times a day (QID) | RECTAL | Status: DC | PRN

## 2011-12-19 MED ORDER — ONDANSETRON HCL 4 MG/2ML IJ SOLN
4.0000 mg | Freq: Four times a day (QID) | INTRAMUSCULAR | Status: DC | PRN
  Administered 2011-12-22: 4 mg via INTRAVENOUS
  Filled 2011-12-19: qty 2

## 2011-12-19 MED ORDER — METHYLPREDNISOLONE SODIUM SUCC 40 MG IJ SOLR
40.0000 mg | Freq: Two times a day (BID) | INTRAMUSCULAR | Status: DC
  Administered 2011-12-19 – 2011-12-20 (×2): 40 mg via INTRAVENOUS
  Filled 2011-12-19 (×2): qty 1

## 2011-12-19 MED ORDER — ALBUTEROL SULFATE (5 MG/ML) 0.5% IN NEBU
2.5000 mg | INHALATION_SOLUTION | Freq: Four times a day (QID) | RESPIRATORY_TRACT | Status: DC
  Administered 2011-12-19 – 2011-12-21 (×9): 2.5 mg via RESPIRATORY_TRACT
  Filled 2011-12-19 (×9): qty 0.5

## 2011-12-19 MED ORDER — IPRATROPIUM BROMIDE 0.02 % IN SOLN
0.5000 mg | Freq: Once | RESPIRATORY_TRACT | Status: AC
  Administered 2011-12-19: 0.5 mg via RESPIRATORY_TRACT
  Filled 2011-12-19: qty 2.5

## 2011-12-19 MED ORDER — ALUM & MAG HYDROXIDE-SIMETH 200-200-20 MG/5ML PO SUSP: 30.0000 mL | Freq: Four times a day (QID) | ORAL | Status: DC | PRN

## 2011-12-19 MED ORDER — ACETAMINOPHEN 325 MG PO TABS: 650.0000 mg | ORAL_TABLET | Freq: Four times a day (QID) | ORAL | Status: DC | PRN

## 2011-12-19 MED ORDER — LEVOFLOXACIN IN D5W 500 MG/100ML IV SOLN
INTRAVENOUS | Status: AC
  Filled 2011-12-19: qty 100

## 2011-12-19 MED ORDER — ENOXAPARIN SODIUM 30 MG/0.3ML ~~LOC~~ SOLN
30.0000 mg | SUBCUTANEOUS | Status: DC
  Administered 2011-12-19 – 2011-12-20 (×2): 30 mg via SUBCUTANEOUS
  Filled 2011-12-19 (×2): qty 0.3

## 2011-12-19 NOTE — H&P (Signed)
Hospital Admission Note Date: 12/19/2011  Patient name: Cheyenne Case Medical record number: [id number] Date of birth: 05/23/1941 Age: 71 y.o. Gender: female PCP: Leonides Grills, MD, MD  Attending physician: Delfina Redwood, MD  Chief Complaint: diarrhea, weakness  History of Present Illness:  Cheyenne Case is an 71 y.o. female presents to the emergency room with weakness and frequent watery stools. She has had a laparoscopy which are infection and was started on doxycycline earlier this week. She thinks her diarrhea may be related to this. She has shortness of breath and was wheezing quite a bit in the emergency room. She is unable to ambulate even 10 steps without difficulty because of difficulty breathing and weakness. The patient's brother gives much of the history. The patient herself minimizes many of her symptoms. The patient lives with her brother and he has become quite worried. She is been unable to do much over the past 6 months, and never leaves the house because of dyspnea on exertion. She's never had pulmonary function tests. She was admitted overnight a month ago for COPD exacerbation. Apparently an echocardiogram was ordered but not done during that hospitalization. She denies depression. She reports her appetite has been poor. Cough productive of clear sputum. Wheezing.  Past Medical History  Diagnosis Date  . Emphysema   . Hypertension   . COPD (chronic obstructive pulmonary disease)     Meds: Prescriptions prior to admission  Medication Sig Dispense Refill  . albuterol (PROVENTIL) (2.5 MG/3ML) 0.083% nebulizer solution Take 2.5 mg by nebulization every 4 (four) hours as needed. For shortness of breath      . budesonide-formoterol (SYMBICORT) 160-4.5 MCG/ACT inhaler Inhale 2 puffs into the lungs 2 (two) times daily.      Marland Kitchen doxycycline (VIBRA-TABS) 100 MG tablet Take 100 mg by mouth 2 (two) times daily. Started on 12/14/11 for 10 day treatment      . furosemide (LASIX)  20 MG tablet Take 20 mg by mouth 2 (two) times daily.      Marland Kitchen ipratropium (ATROVENT) 0.02 % nebulizer solution Take 500 mcg by nebulization 4 (four) times daily as needed. For shortness of breath      . lisinopril (PRINIVIL,ZESTRIL) 20 MG tablet Take 20 mg by mouth 2 (two) times daily.        Allergies: Review of patient's allergies indicates no known allergies. History   Social History  . Marital Status: Single    Spouse Name: N/A    Number of Children: N/A  . Years of Education: N/A   Occupational History  . Not on file.   Social History Main Topics  . Smoking status: Former Research scientist (life sciences)  . Smokeless tobacco: Not on file  . Alcohol Use: No  . Drug Use: No  . Sexually Active:    Other Topics Concern  . Not on file   Social History Narrative  . No narrative on file   History reviewed. No pertinent family history. History reviewed. No pertinent past surgical history.  Review of Systems: Systems reviewed and as per HPI, otherwise negative.  Physical Exam: Blood pressure 112/60, pulse 74, temperature 97.9 F (36.6 C), temperature source Oral, resp. rate 20, height _0  (1.6 m), weight 61.009 kg (134 lb 8 oz), SpO2 95.00%. BP 112/60  Pulse 74  Temp(Src) 97.9 F (36.6 C) (Oral)  Resp 20  Ht _1  (1.6 m)  Wt 61.009 kg (134 lb 8 oz)  BMI 23.83 kg/m2  SpO2 95%  General Appearance:  Alert, cooperative, no distress, appears stated age, obese  Head:    Normocephalic, without obvious abnormality, atraumatic  Eyes:    PERRL, conjunctiva/corneas clear, EOM's intact, fundi    benign, both eyes  Ears:    Normal TM's and external ear canals, both ears  Nose:   turbinates boggy, septum midline, no drainage or sinus tenderness  Throat:   dry mucous membranes   Neck:   Supple, symmetrical, trachea midline, no adenopathy;    thyroid:  no enlargement/tenderness/nodules; no carotid   bruit or JVD  Back:     Symmetric, no curvature, ROM normal, no CVA tenderness  Lungs:      diminished throughout with poor air movement. No wheezes rhonchi or rales   Chest Wall:    No tenderness or deformity   Heart:    Regular rate and rhythm, S1 and S2 normal, no murmur, rub   or gallop     Abdomen:     Soft, non-tender, bowel sounds active all four quadrants,    no masses, no organomegaly, obese   Genitalia:   deferred   Rectal:   deferred   Extremities:   Extremities normal, atraumatic, no cyanosis or edema  Pulses:   2+ and symmetric all extremities  Skin:   Skin color, texture, turgor normal, no rashes or lesions  Lymph nodes:   Cervical, supraclavicular, and axillary nodes normal  Neurologic:   CNII-XII intact, normal strength, sensation and reflexes    throughout    Psychiatric: flat affect. Poor eye contact.  Lab results: Basic Metabolic Panel:  Basename 12/19/11 1030  NA 135  K 4.2  CL 99  CO2 23  GLUCOSE 115*  BUN 38*  CREATININE 1.47*  CALCIUM 9.0  MG --  PHOS --   Liver Function Tests: No results found for this basename: AST:2,ALT:2,ALKPHOS:2,BILITOT:2,PROT:2,ALBUMIN:2 in the last 72 hours No results found for this basename: LIPASE:2,AMYLASE:2 in the last 72 hours No results found for this basename: AMMONIA:2 in the last 72 hours CBC:  Basename 12/19/11 0914  WBC 4.6  NEUTROABS 3.0  HGB 11.5*  HCT 35.3*  MCV 94.6  PLT 328   Imaging results:  Dg Chest Port 1 View  12/19/2011  *RADIOLOGY REPORT*  Clinical Data: Cough.  PORTABLE CHEST - 1 VIEW  Comparison: 11/08/2011  Findings: Hyperinflation suggests COPD.  Calcification likely in the left lateral breast is not significantly changed at 1.5 cm.  Patient rotated to the right. Normal heart size.  Aortic atherosclerosis.  Artifact projects in the right paratracheal region.  There may be a small hiatal hernia. No pleural effusion or pneumothorax.  Clear lungs.  IMPRESSION: Hyperinflation/COPD. No acute superimposed process.  Mildly degraded by obliquity and artifact, projecting over the right  paratracheal region.  Similar calcification injecting over the left breast, indeterminate.  Original Report Authenticated By: Areta Haber, M.D.    Assessment & Plan: Active Problems:  Acute bronchitis  COPD with acute exacerbation  Diarrhea  Dehydration  Weakness generalized  Anemia  Anorexia  Acute renal insufficiency  Benign hypertension  Obesity  Will need to rule out C. difficile. Could be antibiotic associated diarrhea. Continue bronchodilators. Add Solu-Medrol. Changed to levofloxacin. Physical therapy consult. IV fluids. Hold antihypertensives and diuretics. Check TSH, vitamin D level, B12. Proceed with echocardiogram. Will likely need outpatient pulmonary function testing to further evaluate her fatigue malaise and dyspnea on exertion if workup is otherwise negative.  [place] L 12/19/2011, 4:42 PM

## 2011-12-19 NOTE — ED Notes (Signed)
Report given to [facility] unit 300. Ready to receive patient.

## 2011-12-19 NOTE — ED Notes (Signed)
Pt states she is experiencing loss of appetite and diarrhea for 2 days. Respiratory infection began Monday - doxycycline Rx.

## 2011-12-19 NOTE — ED Provider Notes (Signed)
History   Scribed for Sharyon Cable, MD, the patient was seen in [facility]. The chart was scribed by Clarisa Fling. The patients care was started at 8:51 AM.   CSN: [id number]  Arrival date & time 12/19/11  4196   First MD Initiated Contact with Patient 12/19/11 [phone]      Chief Complaint  Patient presents with  . Anorexia  . Diarrhea    HPI0 Cheyenne Case is a 71 y.o. female who presents to the Emergency Department complaining of diarrhea (~4x today) and loss of appetite onset two days. Denies any vomiiting, abdominal pain, syncope or blood in stool. Reports respiratory infection began Monday (5 days prior) with doxycyline Rx. Also notes recent admission one month prior. There are no other associated symptoms and no other alleviating or aggravating factors.  Her course is worsening   Past Medical History  Diagnosis Date  . Emphysema   . Hypertension   . COPD (chronic obstructive pulmonary disease)     History reviewed. No pertinent past surgical history.  No family history on file.  History  Substance Use Topics  . Smoking status: Former Research scientist (life sciences)  . Smokeless tobacco: Not on file  . Alcohol Use: No    OB History    Grav Para Term Preterm Abortions TAB SAB Ect Mult Living                  Review of Systems  Constitutional: Positive for appetite change.  Gastrointestinal: Positive for nausea and diarrhea. Negative for vomiting, abdominal pain and blood in stool.  Neurological: Negative for syncope.  All other systems reviewed and are negative.    Allergies  Review of patient's allergies indicates no known allergies.  Home Medications   Current Outpatient Rx  Name Route Sig Dispense Refill  . FUROSEMIDE 20 MG PO TABS Oral Take 20 mg by mouth 2 (two) times daily.    . IPRATROPIUM BROMIDE HFA 17 MCG/ACT IN AERS Inhalation Inhale 2 puffs into the lungs every 6 (six) hours. Shortness of breath    . IPRATROPIUM-ALBUTEROL 0.5-2.5 (3) MG/3ML IN SOLN Nebulization  Take 3 mLs by nebulization 4 (four) times daily.    Marland Kitchen LISINOPRIL PO Oral Take 2 tablets by mouth daily. Patient can not remember strength    . PREDNISONE 20 MG PO TABS  Take 2 tablets daily for 3 days, then 1 tablet daily for 3 days, then half tablet daily for 3 days, then STOP. 12 tablet 0    BP 132/64  Temp(Src) 98.3 F (36.8 C) (Oral)  Resp 24  Ht _0  (1.6 m)  Wt 136 lb (61.689 kg)  BMI 24.09 kg/m2  SpO2 93%  Physical Exam CONSTITUTIONAL: Well developed/well nourished HEAD AND FACE: Normocephalic/atraumatic EYES: EOMI/PERRL ENMT: Mucous membranes moist NECK: supple no meningeal signs SPINE:entire spine nontender CV: S1/S2 noted, no murmurs/rubs/gallops noted LUNGS:  Bilateral wheezing no distress noted, no hypoxia on RA. She is able to speak to me comfortably ABDOMEN: soft, nontender, no rebound or guarding GU:no cva tenderness NEURO: Pt is awake/alert, moves all extremitiesx4 EXTREMITIES: pulses normal, full ROM SKIN: warm, color normal PSYCH: no abnormalities of mood noted   ED Course  Procedures   DIAGNOSTIC STUDIES: Oxygen Saturation is 93% on [place], low by my interpretation.    LABS Results for orders placed during the hospital encounter of 12/19/11  CBC      Component Value Range   WBC 4.6  4.0 - 10.5 (K/uL)   RBC 3.73 (*)  3.87 - 5.11 (MIL/uL)   Hemoglobin 11.5 (*) 12.0 - 15.0 (g/dL)   HCT 35.3 (*) 36.0 - 46.0 (%)   MCV 94.6  78.0 - 100.0 (fL)   MCH 30.8  26.0 - 34.0 (pg)   MCHC 32.6  30.0 - 36.0 (g/dL)   RDW 13.1  11.5 - 15.5 (%)   Platelets 328  150 - 400 (K/uL)  DIFFERENTIAL      Component Value Range   Neutrophils Relative 63  43 - 77 (%)   Lymphocytes Relative 29  12 - 46 (%)   Monocytes Relative 7  3 - 12 (%)   Eosinophils Relative 1  0 - 5 (%)   Basophils Relative 0  0 - 1 (%)   Neutro Abs 3.0  1.7 - 7.7 (K/uL)   Lymphs Abs 1.3  0.7 - 4.0 (K/uL)   Monocytes Absolute 0.3  0.1 - 1.0 (K/uL)   Eosinophils Absolute 0.0  0.0 - 0.7 (K/uL)    Basophils Absolute 0.0  0.0 - 0.1 (K/uL)   WBC Morphology ATYPICAL LYMPHOCYTES    BASIC METABOLIC PANEL      Component Value Range   Sodium 135  135 - 145 (mEq/L)   Potassium 4.2  3.5 - 5.1 (mEq/L)   Chloride 99  96 - 112 (mEq/L)   CO2 23  19 - 32 (mEq/L)   Glucose, Bld 115 (*) 70 - 99 (mg/dL)   BUN 38 (*) 6 - 23 (mg/dL)   Creatinine, Ser 1.47 (*) 0.50 - 1.10 (mg/dL)   Calcium 9.0  8.4 - 10.5 (mg/dL)   GFR calc non Af Amer 35 (*) >90 (mL/min)   GFR calc Af Amer 40 (*) >90 (mL/min)     COORDINATION OF CARE: 8:51am:  - Patient evaluated by ED physician, Imodium, Zofran, CBC, Diff ordered Pt with recent admission for COPD and recent antibiotics for "resp infection" which she reports is improved Will check labs and reassess.    12:22 PM Pt now with worsened cough  will give nebs, check CXR and reassess 1:45 PM Pt with worsened wheezing/tightness will admit for dehydration and COPD D/w triad dr Conley Canal will admit    MDM  Nursing notes reviewed and considered in documentation All labs/vitals reviewed and considered Previous records reviewed and considered xrays reviewed and considered   Date: 12/19/2011  Rate: 82  Rhythm: normal sinus rhythm  QRS Axis: normal  Intervals: normal  ST/T Wave abnormalities: nonspecific ST changes  Conduction Disutrbances:none  Narrative Interpretation:   Old EKG Reviewed: unchanged    I personally performed the services described in this documentation, which was scribed in my presence. The recorded information has been reviewed and considered.           Sharyon Cable, MD 12/19/11 1400

## 2011-12-19 NOTE — ED Notes (Signed)
Respiratory paged for breathing treatment

## 2011-12-19 NOTE — ED Notes (Signed)
MD notified of pt discomfort/increased tightness in chest.

## 2011-12-20 LAB — COMPREHENSIVE METABOLIC PANEL
ALT: 11 U/L (ref 0–35)
AST: 19 U/L (ref 0–37)
Albumin: 2.8 g/dL — ABNORMAL LOW (ref 3.5–5.2)
Alkaline Phosphatase: 75 U/L (ref 39–117)
Chloride: 107 mEq/L (ref 96–112)
Potassium: 4.8 mEq/L (ref 3.5–5.1)
Sodium: 137 mEq/L (ref 135–145)
Total Protein: 6.2 g/dL (ref 6.0–8.3)

## 2011-12-20 MED ORDER — LEVOFLOXACIN 500 MG PO TABS
500.0000 mg | ORAL_TABLET | Freq: Every day | ORAL | Status: DC
  Administered 2011-12-20 – 2011-12-21 (×2): 500 mg via ORAL
  Filled 2011-12-20 (×2): qty 1

## 2011-12-20 MED ORDER — ENSURE PUDDING PO PUDG
1.0000 | Freq: Three times a day (TID) | ORAL | Status: DC
  Administered 2011-12-20 – 2011-12-24 (×11): 1 via ORAL

## 2011-12-20 MED ORDER — SODIUM CHLORIDE 0.9 % IJ SOLN
INTRAMUSCULAR | Status: AC
  Filled 2011-12-20: qty 3

## 2011-12-20 MED ORDER — LORAZEPAM 0.5 MG PO TABS
0.5000 mg | ORAL_TABLET | Freq: Three times a day (TID) | ORAL | Status: DC | PRN
  Administered 2011-12-20 – 2011-12-25 (×4): 0.5 mg via ORAL
  Filled 2011-12-20 (×4): qty 1

## 2011-12-20 MED ORDER — PREDNISONE 20 MG PO TABS
40.0000 mg | ORAL_TABLET | Freq: Two times a day (BID) | ORAL | Status: DC
  Administered 2011-12-20 – 2011-12-21 (×3): 40 mg via ORAL
  Filled 2011-12-20 (×3): qty 2

## 2011-12-20 NOTE — Progress Notes (Signed)
Subjective: No further diarrhea. Shortness of breath and cough about the same. Still weak. Still no appetite. No nausea. Feels as if IV fluids are making her "full".  Objective: Vital signs in last 24 hours: Filed Vitals:   12/19/11 2341 12/20/11 0551 12/20/11 0606 12/20/11 0817  BP:  113/69    Pulse:  77    Temp:  97.4 F (36.3 C)    TempSrc:  Oral    Resp:  22    Height:      Weight:      SpO2: 95% 97% 97% 94%   Weight change:   Intake/Output Summary (Last 24 hours) at 12/20/11 1052 Last data filed at 12/20/11 0500  Gross per 24 hour  Intake    240 ml  Output    500 ml  Net   -260 ml   Physical Exam: Gen.: Brighter, more interactive HEENT: Mucous membranes less dry Lungs  bilateral wheeze and diminished air movement, nonlabored breathing Cardiovascular regular rate rhythm without murmurs gallops rubs Abdomen obese soft nontender Extremities no clubbing cyanosis or edema Psychiatric: Smiling, more talkative  Lab Results: Basic Metabolic Panel:  Lab 06/26/56 0530 12/19/11 1030  NA 137 135  K 4.8 4.2  CL 107 99  CO2 23 23  GLUCOSE 134* 115*  BUN 24* 38*  CREATININE 1.24* 1.47*  CALCIUM 8.4 9.0  MG -- --  PHOS -- --   Liver Function Tests:  Lab 12/20/11 0530  AST 19  ALT 11  ALKPHOS 75  BILITOT 0.3  PROT 6.2  ALBUMIN 2.8*   No results found for this basename: LIPASE:2,AMYLASE:2 in the last 168 hours No results found for this basename: AMMONIA:2 in the last 168 hours CBC:  Lab 12/19/11 0914  WBC 4.6  NEUTROABS 3.0  HGB 11.5*  HCT 35.3*  MCV 94.6  PLT 328   Cardiac Enzymes: No results found for this basename: CKTOTAL:3,CKMB:3,CKMBINDEX:3,TROPONINI:3 in the last 168 hours BNP:  Lab 12/19/11 1730  PROBNP 379.5*   D-Dimer: No results found for this basename: DDIMER:2 in the last 168 hours CBG: No results found for this basename: GLUCAP:6 in the last 168 hours Hemoglobin A1C: No results found for this basename: HGBA1C in the last 168  hours Fasting Lipid Panel: No results found for this basename: CHOL,HDL,LDLCALC,TRIG,CHOLHDL,LDLDIRECT in the last 168 hours Thyroid Function Tests: No results found for this basename: TSH,T4TOTAL,FREET4,T3FREE,THYROIDAB in the last 168 hours Coagulation: No results found for this basename: LABPROT:4,INR:4 in the last 168 hours Anemia Panel: No results found for this basename: VITAMINB12,FOLATE,FERRITIN,TIBC,IRON,RETICCTPCT in the last 168 hours Urine Drug Screen: Drugs of Abuse  No results found for this basename: labopia, cocainscrnur, labbenz, amphetmu, thcu, labbarb    Alcohol Level: No results found for this basename: ETH:2 in the last 168 hours Urinalysis: No results found for this basename: COLORURINE:2,APPERANCEUR:2,LABSPEC:2,PHURINE:2,GLUCOSEU:2,HGBUR:2,BILIRUBINUR:2,KETONESUR:2,PROTEINUR:2,UROBILINOGEN:2,NITRITE:2,LEUKOCYTESUR:2 in the last 168 hours  Micro Results: No results found for this or any previous visit (from the past 240 hour(s)). Studies/Results: Dg Chest Port 1 View  12/19/2011  *RADIOLOGY REPORT*  Clinical Data: Cough.  PORTABLE CHEST - 1 VIEW  Comparison: 11/08/2011  Findings: Hyperinflation suggests COPD.  Calcification likely in the left lateral breast is not significantly changed at 1.5 cm.  Patient rotated to the right. Normal heart size.  Aortic atherosclerosis.  Artifact projects in the right paratracheal region.  There may be a small hiatal hernia. No pleural effusion or pneumothorax.  Clear lungs.  IMPRESSION: Hyperinflation/COPD. No acute superimposed process.  Mildly degraded by obliquity and  artifact, projecting over the right paratracheal region.  Similar calcification injecting over the left breast, indeterminate.  Original Report Authenticated By: Areta Haber, M.D.   Scheduled Meds:   . albuterol  2.5 mg Nebulization QID  . albuterol  5 mg Nebulization Once  . albuterol  5 mg Nebulization Once  . budesonide-formoterol  2 puff Inhalation BID  .  enoxaparin  30 mg Subcutaneous Q24H  . feeding supplement  1 Container Oral TID BM  . ipratropium  0.5 mg Nebulization Once  . ipratropium  500 mcg Nebulization QID  . levofloxacin  500 mg Oral Daily  . sodium chloride  500 mL Intravenous Once  . sodium chloride      . sodium chloride      . DISCONTD: levofloxacin (LEVAQUIN) IV  500 mg Intravenous Q24H  . DISCONTD: methylPREDNISolone (SOLU-MEDROL) injection  40 mg Intravenous Q12H   Continuous Infusions:   . DISCONTD: sodium chloride 1,000 mL (12/20/11 0436)   PRN Meds:.acetaminophen, alum & mag hydroxide-simeth, ondansetron (ZOFRAN) IV, ondansetron, DISCONTD: acetaminophen Assessment/Plan: Active Problems:  Acute bronchitis  COPD with acute exacerbation  Diarrhea, resolved  Dehydration, improved  Weakness generalized  Anemia  Anorexia  Acute renal insufficiency  Benign hypertension  Obesity  Discontinue contact precautions. Stop IV fluids. Will not resume Lasix until appetite has improved. Add in sure. Change steroids and antibiotics to by mouth. Await physical therapy eval, echocardiogram, TSH, B12, vitamin D levels. Suspect much of her fatigue and malaise is related to severe deconditioning.   LOS: 1 day   Cheyenne Case L 12/20/2011, 10:52 AM

## 2011-12-20 NOTE — Progress Notes (Signed)
Met with Ms. Tolle, a 71 year old female who lives with her brother, to offer support. In regards to planning ahead for discharge, Ms. Pennella states that her brother is retired and is very supportive. However, when asked about her conditioning and whether or not she would be able to return home from the hospital, Ms. Kenagy stated she was unsure. We agreed to meet again to reassess. Should consents to her brother being apart of the discussion. She is also agreeable to a PT consult for an evaluation of her strength. CSW to follow next week.  Elana Alm, MSW, Environmental manager Clinical Social Work

## 2011-12-21 ENCOUNTER — Inpatient Hospital Stay (HOSPITAL_COMMUNITY): Payer: Medicare Other

## 2011-12-21 LAB — BASIC METABOLIC PANEL
CO2: 27 mEq/L (ref 19–32)
Calcium: 9.5 mg/dL (ref 8.4–10.5)
Creatinine, Ser: 1.44 mg/dL — ABNORMAL HIGH (ref 0.50–1.10)
GFR calc non Af Amer: 36 mL/min — ABNORMAL LOW (ref >90)

## 2011-12-21 LAB — BLOOD GAS, ARTERIAL
Acid-base deficit: 0.9 mmol/L (ref 0.0–2.0)
Bicarbonate: 23.9 mEq/L (ref 20.0–24.0)
O2 Content: 3 L/min
TCO2: 21.7 mmol/L (ref 0–100)
pCO2 arterial: 44.5 mmHg (ref 35.0–45.0)
pO2, Arterial: 67 mmHg — ABNORMAL LOW (ref 80.0–100.0)

## 2011-12-21 LAB — PRO B NATRIURETIC PEPTIDE: Pro B Natriuretic peptide (BNP): 1668 pg/mL — ABNORMAL HIGH (ref 0–125)

## 2011-12-21 LAB — CBC
HCT: 38.1 % (ref 36.0–46.0)
MCHC: 32 g/dL (ref 30.0–36.0)
MCV: 96.7 fL (ref 78.0–100.0)
Platelets: 450 10*3/uL — ABNORMAL HIGH (ref 150–400)
RDW: 13.2 % (ref 11.5–15.5)

## 2011-12-21 LAB — FOLATE RBC: RBC Folate: 785 ng/mL — ABNORMAL HIGH (ref >=366)

## 2011-12-21 LAB — VITAMIN D 25 HYDROXY (VIT D DEFICIENCY, FRACTURES): Vit D, 25-Hydroxy: 13 ng/mL — ABNORMAL LOW (ref 30–89)

## 2011-12-21 MED ORDER — LEVALBUTEROL HCL 0.63 MG/3ML IN NEBU
0.6300 mg | INHALATION_SOLUTION | Freq: Four times a day (QID) | RESPIRATORY_TRACT | Status: DC
  Administered 2011-12-21: 0.63 mg via RESPIRATORY_TRACT
  Filled 2011-12-21 (×2): qty 3

## 2011-12-21 MED ORDER — SODIUM CHLORIDE 0.9 % IJ SOLN
INTRAMUSCULAR | Status: AC
  Administered 2011-12-22: 3 mL
  Filled 2011-12-21: qty 3

## 2011-12-21 MED ORDER — ENOXAPARIN SODIUM 40 MG/0.4ML ~~LOC~~ SOLN
40.0000 mg | SUBCUTANEOUS | Status: DC
  Administered 2011-12-21: 40 mg via SUBCUTANEOUS
  Filled 2011-12-21: qty 0.4

## 2011-12-21 MED ORDER — MORPHINE SULFATE 2 MG/ML IJ SOLN
1.0000 mg | INTRAMUSCULAR | Status: DC | PRN
  Administered 2011-12-21 – 2011-12-24 (×6): 1 mg via INTRAVENOUS
  Filled 2011-12-21 (×7): qty 1

## 2011-12-21 MED ORDER — LEVALBUTEROL HCL 0.63 MG/3ML IN NEBU
0.6300 mg | INHALATION_SOLUTION | RESPIRATORY_TRACT | Status: DC | PRN
Start: 2011-12-21 — End: 2011-12-25
  Administered 2011-12-21: 0.63 mg via RESPIRATORY_TRACT

## 2011-12-21 MED ORDER — DEXTROSE 5 % IV SOLN
1.0000 g | Freq: Two times a day (BID) | INTRAVENOUS | Status: DC
  Administered 2011-12-21 – 2011-12-23 (×4): 1 g via INTRAVENOUS
  Filled 2011-12-21 (×5): qty 1

## 2011-12-21 MED ORDER — LEVALBUTEROL HCL 0.63 MG/3ML IN NEBU
0.6300 mg | INHALATION_SOLUTION | RESPIRATORY_TRACT | Status: DC
  Administered 2011-12-21 – 2011-12-25 (×23): 0.63 mg via RESPIRATORY_TRACT
  Filled 2011-12-21 (×25): qty 3

## 2011-12-21 MED ORDER — MORPHINE SULFATE 2 MG/ML IJ SOLN
1.0000 mg | Freq: Once | INTRAMUSCULAR | Status: AC
  Administered 2011-12-21: 1 mg via INTRAVENOUS
  Filled 2011-12-21: qty 1

## 2011-12-21 MED ORDER — FUROSEMIDE 10 MG/ML IJ SOLN
40.0000 mg | Freq: Once | INTRAMUSCULAR | Status: AC
  Administered 2011-12-21: 40 mg via INTRAVENOUS
  Filled 2011-12-21: qty 4

## 2011-12-21 MED ORDER — VANCOMYCIN HCL IN DEXTROSE 1-5 GM/200ML-% IV SOLN
1000.0000 mg | INTRAVENOUS | Status: DC
  Administered 2011-12-21 – 2011-12-23 (×3): 1000 mg via INTRAVENOUS
  Filled 2011-12-21 (×5): qty 200

## 2011-12-21 MED ORDER — METHYLPREDNISOLONE SODIUM SUCC 125 MG IJ SOLR
60.0000 mg | Freq: Four times a day (QID) | INTRAMUSCULAR | Status: DC
  Administered 2011-12-21 – 2011-12-23 (×8): 60 mg via INTRAVENOUS
  Filled 2011-12-21 (×5): qty 2
  Filled 2011-12-21: qty 4
  Filled 2011-12-21: qty 2

## 2011-12-21 MED ORDER — SODIUM CHLORIDE 0.9 % IJ SOLN
INTRAMUSCULAR | Status: AC
  Administered 2011-12-21: 10 mL
  Filled 2011-12-21: qty 3

## 2011-12-21 NOTE — Consult Note (Signed)
ANTIBIOTIC CONSULT NOTE - INITIAL  Pharmacy Consult for Vancomycin Indication: rule out pna, COPD exacerbation  No Known Allergies  Patient Measurements: Height: _0  (160 cm) Weight: 134 lb 8 oz (61.009 kg) IBW/kg (Calculated) : 52.4   Vital Signs: Temp: 97.6 F (36.4 C) (03/25 0536) Temp src: Oral (03/25 0536) BP: 126/73 mmHg (03/25 0536) Pulse Rate: 76  (03/25 0536) Intake/Output from previous day: 03/24 0701 - 03/25 0700 In: 120 [P.O.:120] Out: 300 [Urine:300] Intake/Output from this shift: Total I/O In: 120 [P.O.:120] Out: -   Labs:  Basename 12/20/11 0530 12/19/11 1030 12/19/11 0914  WBC -- -- 4.6  HGB -- -- 11.5*  PLT -- -- 328  LABCREA -- -- --  CREATININE 1.24* 1.47* --   Estimated Creatinine Clearance: 34.4 ml/min (by C-G formula based on Cr of 1.24). No results found for this basename: VANCOTROUGH:2,VANCOPEAK:2,VANCORANDOM:2,GENTTROUGH:2,GENTPEAK:2,GENTRANDOM:2,TOBRATROUGH:2,TOBRAPEAK:2,TOBRARND:2,AMIKACINPEAK:2,AMIKACINTROU:2,AMIKACIN:2, in the last 72 hours   Microbiology: No results found for this or any previous visit (from the past 720 hour(s)).  Medical History: Past Medical History  Diagnosis Date  . Emphysema   . Hypertension   . COPD (chronic obstructive pulmonary disease)    Medications:  Scheduled:    . budesonide-formoterol  2 puff Inhalation BID  . ceFEPime (MAXIPIME) IV  1 g Intravenous Q12H  . enoxaparin  40 mg Subcutaneous Q24H  . feeding supplement  1 Container Oral TID BM  . ipratropium  500 mcg Nebulization QID  . levalbuterol  0.63 mg Nebulization Q4H  . methylPREDNISolone (SOLU-MEDROL) injection  60 mg Intravenous Q6H  .  morphine injection  1 mg Intravenous Once  . sodium chloride      . vancomycin  1,000 mg Intravenous Q24H  . DISCONTD: albuterol  2.5 mg Nebulization QID  . DISCONTD: enoxaparin  30 mg Subcutaneous Q24H  . DISCONTD: levalbuterol  0.63 mg Nebulization QID  . DISCONTD: levofloxacin  500 mg Oral Daily  .  DISCONTD: predniSONE  40 mg Oral BID   Assessment: Reduced renal fxn (ClCr ~ 34)  Goal of Therapy:  Vancomycin trough level 15-20 mcg/ml  Plan: Vancomycin 1gm iv q24hrs Check trough at steady state.  [place] Crane, Keino Placencia A 12/21/2011,11:41 AM

## 2011-12-21 NOTE — Progress Notes (Signed)
Clinical Social Work Department BRIEF PSYCHOSOCIAL ASSESSMENT 12/21/2011  Patient:  Cheyenne Case, Cheyenne Case     Account Number:  [id number]     Admit date:  12/19/2011  Clinical Social Worker:  Daiva Huge  Date/Time:  12/21/2011 12:56 PM  Referred by:  Physician  Date Referred:  12/21/2011 Referred for  SNF Placement   Other Referral:   Interview type:  Patient Other interview type:   Spoke with patient and her brother [name] [phone] at bedside    PSYCHOSOCIAL DATA Living Status:  FAMILY Admitted from facility:   Level of care:   Primary support name:  brother [name] [phone] Primary support relationship to patient:  FAMILY Degree of support available:   good    CURRENT CONCERNS Current Concerns  Adjustment to Illness  Post-Acute Placement  Financial Resources   Other Concerns:    SOCIAL WORK ASSESSMENT / PLAN Plan to follow along and assist with SNF placement for patient at d/c. She lives with her brother and he is able to assist some but patient will benefit from a SNF stay at d/c.  Her monthly income is $300 and she has Medicare only- Fortunately her brother has been able to take her in andprovide housing, etc. as her income is so limited. Encouraged them to apply for Medicaid and her brother plans to do this asap.   Assessment/plan status:  Other - See comment Other assessment/ plan:   Plan for SNF search and Medicaid application.   Information/referral to community resources:   DSS for Medicaid.    PATIENT'S/FAMILY'S RESPONSE TO PLAN OF CARE: Patient and family appreciative of assistance and agree to plans for SNF search.

## 2011-12-21 NOTE — Progress Notes (Signed)
Called by nurse that patient is short of breath again despite bronchodilators and a dose of morphine. Will give a dose of Lasix and check BNP. She's not currently on IV fluids, and has no history of heart failure. On physical exam, patient has pursed lip breathing but actually looks better than she did earlier today. She does have diffuse wheezing

## 2011-12-21 NOTE — Progress Notes (Signed)
Clinical Social Work Department CLINICAL SOCIAL WORK PLACEMENT NOTE 12/21/2011  Patient:  Cheyenne Case  Account Number:  [id number] Admit date:  12/20/2011  Clinical Social Worker:  Daiva Huge  Date/time:  12/21/2011 01:05 PM  Clinical Social Work is seeking post-discharge placement for this patient at the following level of care:   SKILLED NURSING   (*CSW will update this form in Epic as items are completed)   12/21/2011  Patient/family provided with [facility] Department of Clinical Social Work's list of facilities offering this level of care within the geographic area requested by the patient (or if unable, by the patient's family).  12/21/2011  Patient/family informed of their freedom to choose among providers that offer the needed level of care, that participate in Medicare, Medicaid or managed care program needed by the patient, have an available bed and are willing to accept the patient.  12/21/2011  Patient/family informed of [facility]' ownership interest in [facility], as well as of the fact that they are under no obligation to receive care at this facility.  PASARR submitted to EDS on 12/21/2011 PASARR number received from [employer]DS on   FL2 transmitted to all facilities in geographic area requested by pt/family on   FL2 transmitted to all facilities within larger geographic area on   Patient informed that his/her managed care company has contracts with or will negotiate with  certain facilities, including the following:     Patient/family informed of bed offers received:   Patient chooses bed at  Physician recommends and patient chooses bed at    Patient to be transferred to  on   Patient to be transferred to facility by   The following physician request were entered in Epic:   Additional Comments:

## 2011-12-21 NOTE — Progress Notes (Signed)
Patient was seen twice today, initially because of reports of severe respiratory distress  Subjective: Breathing became suddenly much worse midmorning today. Bronchodilators were not helping. 1 mg of morphine did help. Now feeling better.  Objective: Vital signs in last 24 hours: Filed Vitals:   12/21/11 0653 12/21/11 0918 12/21/11 1049 12/21/11 1438  BP:    118/66  Pulse:    98  Temp:    97.7 F (36.5 C)  TempSrc:    Oral  Resp:    20  Height:      Weight:      SpO2: 98% 91% 93% 95%   Weight change:   Intake/Output Summary (Last 24 hours) at 12/21/11 1646 Last data filed at 12/21/11 1319  Gross per 24 hour  Intake    490 ml  Output    300 ml  Net    190 ml   Physical Exam:  Gen.: Initially, moderate to severe respiratory distress, tripod position, short sentences, now more comfortable lying, at 30 angle and breathing comfortably  Lungs: Initially poor lung sounds with poor air movement and wheezing, now with better air movement but continued wheeze Cardiovascular regular rate rhythm without murmurs gallops rubs Abdomen obese soft nontender Extremities no clubbing cyanosis or edema Psychiatric: Initially, quite anxious, now more comfortable and calm  Lab Results: Basic Metabolic Panel:  Lab 32/95/18 0530 12/19/11 1030  NA 137 135  K 4.8 4.2  CL 107 99  CO2 23 23  GLUCOSE 134* 115*  BUN 24* 38*  CREATININE 1.24* 1.47*  CALCIUM 8.4 9.0  MG -- --  PHOS -- --   Liver Function Tests:  Lab 12/20/11 0530  AST 19  ALT 11  ALKPHOS 75  BILITOT 0.3  PROT 6.2  ALBUMIN 2.8*   No results found for this basename: LIPASE:2,AMYLASE:2 in the last 168 hours No results found for this basename: AMMONIA:2 in the last 168 hours CBC:  Lab 12/19/11 0914  WBC 4.6  NEUTROABS 3.0  HGB 11.5*  HCT 35.3*  MCV 94.6  PLT 328   Cardiac Enzymes: No results found for this basename: CKTOTAL:3,CKMB:3,CKMBINDEX:3,TROPONINI:3 in the last 168 hours BNP:  Lab 12/19/11 1730    PROBNP 379.5*   D-Dimer: No results found for this basename: DDIMER:2 in the last 168 hours CBG: No results found for this basename: GLUCAP:6 in the last 168 hours Hemoglobin A1C: No results found for this basename: HGBA1C in the last 168 hours Fasting Lipid Panel: No results found for this basename: CHOL,HDL,LDLCALC,TRIG,CHOLHDL,LDLDIRECT in the last 168 hours Thyroid Function Tests:  Lab 12/19/11 1730  TSH 4.158  T4TOTAL --  FREET4 --  T3FREE --  THYROIDAB --   Coagulation: No results found for this basename: LABPROT:4,INR:4 in the last 168 hours Anemia Panel:  Lab 12/19/11 1730  VITAMINB12 275  FOLATE --  FERRITIN --  TIBC --  IRON --  RETICCTPCT --   Urine Drug Screen: Drugs of Abuse  No results found for this basename: labopia,  cocainscrnur,  labbenz,  amphetmu,  thcu,  labbarb    Alcohol Level: No results found for this basename: ETH:2 in the last 168 hours Urinalysis: No results found for this basename: COLORURINE:2,APPERANCEUR:2,LABSPEC:2,PHURINE:2,GLUCOSEU:2,HGBUR:2,BILIRUBINUR:2,KETONESUR:2,PROTEINUR:2,UROBILINOGEN:2,NITRITE:2,LEUKOCYTESUR:2 in the last 168 hours  Micro Results: No results found for this or any previous visit (from the past 240 hour(s)). Studies/Results: Dg Chest Port 1 View  12/21/2011  *RADIOLOGY REPORT*  Clinical Data: COPD.  Short of breath.  Dyspnea.  PORTABLE CHEST - 1 VIEW  Comparison: 12/19/2011.  10/05/2009.  Findings: Bosselation of the left hemidiaphragm is present.  Small contour abnormality is present at the left cardiophrenic angle which appears little changed compared to 10/05/2009 and probably represents either a small hiatal hernia (which was present on prior CT 05/24/2009).  Emphysema is present.  Apical lordotic projection.  No airspace disease.  Left basilar scarring and / or atelectasis is present. This appears similar to both recent comparison exam and remote comparison exam.  Cardiopericardial silhouette unchanged.   IMPRESSION: Emphysema.  No acute cardiopulmonary disease.  Stable small hiatal hernia and left basilar atelectasis/scarring.  Original Report Authenticated By: Dereck Ligas, M.D.   Scheduled Meds:    . budesonide-formoterol  2 puff Inhalation BID  . ceFEPime (MAXIPIME) IV  1 g Intravenous Q12H  . enoxaparin  40 mg Subcutaneous Q24H  . feeding supplement  1 Container Oral TID BM  . ipratropium  500 mcg Nebulization QID  . levalbuterol  0.63 mg Nebulization Q4H  . methylPREDNISolone (SOLU-MEDROL) injection  60 mg Intravenous Q6H  .  morphine injection  1 mg Intravenous Once  . sodium chloride      . sodium chloride      . vancomycin  1,000 mg Intravenous Q24H  . DISCONTD: albuterol  2.5 mg Nebulization QID  . DISCONTD: enoxaparin  30 mg Subcutaneous Q24H  . DISCONTD: levalbuterol  0.63 mg Nebulization QID  . DISCONTD: levofloxacin  500 mg Oral Daily  . DISCONTD: predniSONE  40 mg Oral BID   Continuous Infusions:  PRN Meds:.acetaminophen, alum & mag hydroxide-simeth, LORazepam, morphine injection, ondansetron (ZOFRAN) IV, ondansetron Assessment/Plan: Active Problems:  Acute bronchitis  COPD with acute exacerbation  Diarrhea, resolved  Dehydration, improved  Weakness generalized  Anemia  Anorexia  Acute renal insufficiency  Benign hypertension  Obesity  Patient's breathing was much worse this morning. I broadened her antibiotics to vancomycin and cefepime and increased her steroids. Morphine also helped her breathlessness. Her ABG, and chest x-ray look okay. She's more comfortable now. She is agreeable to skilled nursing facility placement once she is medically more stable. Check labs tomorrow.   LOS: 2 days   Francine Hannan L 12/21/2011, 4:46 PM

## 2011-12-21 NOTE — Clinical Documentation Improvement (Signed)
RENAL FAILURE DOCUMENTATION CLARIFICATION QUERY  THIS DOCUMENT IS NOT A PERMANENT PART OF THE MEDICAL RECORD  TO RESPOND TO THE THIS QUERY, FOLLOW THE INSTRUCTIONS BELOW:  1. If needed, update documentation for the patient's encounter via the notes activity.  2. Access this query again and click edit on the In [employer].  3. After updating, or not, click F2 to complete all highlighted (required) fields concerning your review. Select "additional documentation in the medical record" OR "no additional documentation provided".  4. Click Sign note button.  5. The deficiency will fall out of your In Basket *Please let us know if you are not able to complete this workflow by phone or e-mail (listed below).  Please update your documentation within the medical record to reflect your response to this query.                                                                                    12/21/11  Dear Dr. Conley Canal  In a better effort to capture your patient's severity of illness, reflect appropriate length of stay and utilization of resources, a review of the patient medical record has revealed the following indicators.  Please clarify and document in a progress note and/or discharge summary the clinical condition associated with the following supporting information: Please exercise your independent judgment.  The fact that a query is asked, does not imply that any particular answer is desired or expected.  Possible Clinical Conditions Acute Renal Failure Acute Kidney Injury Acute Tubular Necrosis Acute on Chronic Renal Failure Chronic Renal Failure Other Condition___ Cannot Clinically Determine     Clinical Information:  Risk Factors: history of hypertension Current diarrhea, anorexia, & dehydration "Acute renal insufficiency"  Signs and Symptoms: weakness  Diagnostics: -Lab: -Component   Latest Ref Rng  11/09/2011  12/19/2011  12/20/2011  BUN    6 - 23 mg/dL   22   38 (H)   24  (H)  Creat    0.50 - 1.10 mg/dL  1.30 (H)  1.47 (H) 1.24 (H)  GFR calc non Af Amer >90 mL/min    40 (L)    35 (L)    43 (L)  Treatments: Frequent Monitoring   You may use possible, probable, or suspect with inpatient documentation. possible, probable, suspected diagnoses MUST be documented at the time of discharge  Reviewed:  no additional documentation provided  Thank You,  Estella Husk RN, MSN Clinical Documentation Specialist: Office# [phone] [place]

## 2011-12-21 NOTE — Progress Notes (Signed)
UR Chart Review Completed

## 2011-12-21 NOTE — Evaluation (Signed)
Physical Therapy Evaluation Patient Details Name: Cheyenne Case MRN: [id number] DOB: September 13, 1941 Today's Date: 12/21/2011  Problem List:  Patient Active Problem List  Diagnoses  . Acute bronchitis  . COPD with acute exacerbation  . Weakness generalized  . Diarrhea  . Anemia  . Anorexia  . Dehydration  . Acute renal insufficiency  . Benign hypertension  . Obesity    Past Medical History:  Past Medical History  Diagnosis Date  . Emphysema   . Hypertension   . COPD (chronic obstructive pulmonary disease)    Past Surgical History: History reviewed. No pertinent past surgical history.  PT Assessment/Plan/Recommendation PT Assessment Clinical Impression Statement: Pt  is very cooperative, apparently  with significant decline in mobility per brother's report.  Now, her mobility is severely limited due to dysppnea.  She is normally able to ambulate with no assistive device, however now needs a walker to maintain stance.  Endurance limited to 12' on 3 L O2.  I am recommending SNF at d/c and pt is in agreement. PT Recommendation/Assessment: Patient will need skilled PT in the acute care venue PT Problem List: Cardiopulmonary status limiting activity;Decreased activity tolerance;Decreased mobility;Decreased knowledge of use of DME Barriers to Discharge: None PT Therapy Diagnosis : Difficulty walking PT Plan PT Frequency: Min 3X/week PT Treatment/Interventions: DME instruction;Gait training;Functional mobility training;Therapeutic activities;Therapeutic exercise;Patient/family education PT Recommendation Follow Up Recommendations: Skilled nursing facility Equipment Recommended: Defer to next venue PT Goals  Acute Rehab PT Goals PT Goal Formulation: With patient Pt will Ambulate: 16 - 50 feet;with supervision;with rolling walker PT Goal: Ambulate - Progress: Goal set today  PT Evaluation Precautions/Restrictions  Precautions Required Braces or Orthoses: No Restrictions Weight  Bearing Restrictions: No Prior Functioning  Home Living Lives With: Family Receives Help From: Family Type of Home: House Home Layout: Laundry or work area in basement;One level Home Access: Stairs to enter Entrance Stairs-Rails: None Entrance Stairs-Number of Steps: 5 Bathroom Shower/Tub: Tub/shower unit Home Adaptive Equipment: None Prior Function Level of Independence: Independent with basic ADLs;Independent with transfers;Independent with gait;Needs assistance with homemaking Driving: No Vocation: Retired Artist: Awake/alert Overall Cognitive Status: Appears within functional limits for tasks assessed Sensation/Coordination Sensation Light Touch: Appears Intact Stereognosis: Not tested Hot/Cold: Not tested Proprioception: Appears Intact Coordination Gross Motor Movements are Fluid and Coordinated: No Coordination and Movement Description: pt is very tremulous from breathing tx and weakness Extremity Assessment RUE Assessment RUE Assessment: Within Functional Limits LUE Assessment LUE Assessment: Within Functional Limits RLE Assessment RLE Assessment: Within Functional Limits LLE Assessment LLE Assessment: Within Functional Limits Mobility (including Balance) Bed Mobility Bed Mobility: Yes Supine to Sit: 6: Modified independent (Device/Increase time) Sit to Supine: 6: Modified independent (Device/Increase time) Transfers Transfers: Yes Sit to Stand: 6: Modified independent (Device/Increase time) Stand to Sit: 6: Modified independent (Device/Increase time) Ambulation/Gait Ambulation/Gait: Yes Ambulation/Gait Assistance: 4: Min assist Ambulation/Gait Assistance Details (indicate cue type and reason): gait is very labored. v.c. for increased thoracic ext Ambulation Distance (Feet): 12 Feet Assistive device: Rolling walker Gait Pattern: Trunk flexed;Shuffle Stairs: No Architect: No  Posture/Postural  Control Posture/Postural Control: No significant limitations Balance Balance Assessed: No Exercise    End of Session PT - End of Session Equipment Utilized During Treatment: Gait belt Activity Tolerance: Patient limited by fatigue (treatment limited by dyspnea) Patient left: in chair;with call bell in reach;with family/visitor present General Behavior During Session: [facility] for tasks performed Cognition: [facility] for tasks performed  Sable Feil 12/21/2011, 9:26 AM

## 2011-12-22 DIAGNOSIS — I517 Cardiomegaly: Secondary | ICD-10-CM

## 2011-12-22 MED ORDER — SODIUM CHLORIDE 0.9 % IJ SOLN
INTRAMUSCULAR | Status: AC
  Administered 2011-12-22: 01:00:00
  Filled 2011-12-22: qty 3

## 2011-12-22 MED ORDER — SODIUM CHLORIDE 0.9 % IJ SOLN
INTRAMUSCULAR | Status: AC
  Filled 2011-12-22: qty 3

## 2011-12-22 MED ORDER — SODIUM CHLORIDE 0.9 % IJ SOLN
INTRAMUSCULAR | Status: AC
  Administered 2011-12-22: 11:00:00
  Filled 2011-12-22: qty 3

## 2011-12-22 MED ORDER — VITAMIN D (ERGOCALCIFEROL) 1.25 MG (50000 UNIT) PO CAPS
50000.0000 [IU] | ORAL_CAPSULE | ORAL | Status: DC
  Administered 2011-12-22: 50000 [IU] via ORAL
  Filled 2011-12-22: qty 1

## 2011-12-22 MED ORDER — ENOXAPARIN SODIUM 30 MG/0.3ML ~~LOC~~ SOLN
30.0000 mg | SUBCUTANEOUS | Status: DC
  Administered 2011-12-22 – 2011-12-24 (×3): 30 mg via SUBCUTANEOUS
  Filled 2011-12-22 (×3): qty 0.3

## 2011-12-22 MED ORDER — FUROSEMIDE 20 MG PO TABS
20.0000 mg | ORAL_TABLET | Freq: Two times a day (BID) | ORAL | Status: DC
  Administered 2011-12-22 – 2011-12-23 (×3): 20 mg via ORAL
  Filled 2011-12-22 (×3): qty 1

## 2011-12-22 NOTE — Progress Notes (Signed)
*  PRELIMINARY RESULTS* Echocardiogram 2D Echocardiogram has been performed.  Tera Partridge 12/22/2011, 11:17 AM

## 2011-12-22 NOTE — Progress Notes (Signed)
Brother at bedside- patient appears to be breathing somewhat better today. DIscussed plans with them again for SNF at d/c- she has been living with her brother and he plans to apply for Medicaid and Food Stamps asap. SNF search is underway- will follow and advise on offers as rec'd. Eduard Clos, MSW, [place]

## 2011-12-22 NOTE — Progress Notes (Signed)
Physical Therapy Treatment Patient Details Name: Cheyenne Case MRN: [id number] DOB: 1940/12/01 Today's Date: 12/22/2011  TIME: 954-956/ 1 TE - 1 GT  PT Assessment/Plan  PT - Assessment/Plan Comments on Treatment Session: Pt has good strength with all exercises but is limited by dyspnea. Initial O2 sats supine were 93% 2L, upon supine exercises O2 dropped to 86%, per nursing O2 increased to 3L. Patient's O2 sats remained at  92% and above during remainder of therapy with the expection of during ambulation  dropped to 88% 3L. Upon seated rest O2 back ro 83% within 1 min. Patient was ble to increase gait distance today. PT Goals  Acute Rehab PT Goals PT Goal: Ambulate - Progress: Progressing toward goal  PT Treatment Precautions/Restrictions  Precautions Required Braces or Orthoses: No Restrictions Weight Bearing Restrictions: No Mobility (including Balance) Bed Mobility Supine to Sit: 6: Modified independent (Device/Increase time) Sit to Supine: 6: Modified independent (Device/Increase time) Transfers Transfers: Yes Sit to Stand: 6: Modified independent (Device/Increase time) Stand to Sit: 5: Supervision Stand to Sit Details: verbal cues needed for reaching back to surface Ambulation/Gait Ambulation/Gait: Yes Ambulation/Gait Assistance: 4: Min assist Ambulation/Gait Assistance Details (indicate cue type and reason): RW Ambulation Distance (Feet): 18 Feet Assistive device: Rolling walker Stairs: No Wheelchair Mobility Wheelchair Mobility: No    Exercise  General Exercises - Upper Extremity Shoulder Flexion: AROM;Both;10 reps;Seated General Exercises - Lower Extremity Ankle Circles/Pumps: Both;20 reps Quad Sets: Both;15 reps Gluteal Sets: Other (comment) (15 reps) Short Arc Quad: Both;10 reps Long Arc Quad: Both;10 reps Heel Slides: Both;10 reps Hip ABduction/ADduction: Both;10 reps Straight Leg Raises: Both;10 reps End of Session PT - End of Session Equipment  Utilized During Treatment: Gait belt Activity Tolerance: Patient tolerated treatment well;Patient limited by fatigue Patient left: in chair;with call bell in reach;with family/visitor present (chair alarm set) General Behavior During Session: [facility] for tasks performed Cognition: [facility] for tasks performed  Jelani Trueba, [place] 12/22/2011, 10:11 AM

## 2011-12-22 NOTE — Progress Notes (Signed)
Subjective: Received IV Lasix at 6 PM last night with good results. Still feels a little short of breath.  Objective: Vital signs in last 24 hours: Filed Vitals:   12/22/11 0332 12/22/11 0511 12/22/11 0656 12/22/11 1112  BP:  109/68    Pulse:  90    Temp:  97.9 F (36.6 C)    TempSrc:  Oral    Resp:  20    Height:      Weight:      SpO2: 100% 95% 9% 95%   Weight change:   Intake/Output Summary (Last 24 hours) at 12/22/11 1442 Last data filed at 12/22/11 1200  Gross per 24 hour  Intake    566 ml  Output    850 ml  Net   -284 ml   Physical Exam:  Gen.: More comfortable. Still with pursed lip breathing however. Able to speak in short sentences  Lungs: Bilateral wheezes. No rales. Breathing nonlabored. Prolonged expiratory phase Cardiovascular regular rate rhythm without murmurs gallops rubs Abdomen obese soft nontender Extremities no clubbing cyanosis or 1+ edema Psychiatric: Calm  Lab Results: Basic Metabolic Panel:  Lab 31/54/00 1800 12/20/11 0530  NA 138 137  K 5.2* 4.8  CL 104 107  CO2 27 23  GLUCOSE 150* 134*  BUN 26* 24*  CREATININE 1.44* 1.24*  CALCIUM 9.5 8.4  MG -- --  PHOS -- --   Liver Function Tests:  Lab 12/20/11 0530  AST 19  ALT 11  ALKPHOS 75  BILITOT 0.3  PROT 6.2  ALBUMIN 2.8*   No results found for this basename: LIPASE:2,AMYLASE:2 in the last 168 hours No results found for this basename: AMMONIA:2 in the last 168 hours CBC:  Lab 12/21/11 1801 12/19/11 0914  WBC 16.5* 4.6  NEUTROABS -- 3.0  HGB 12.2 11.5*  HCT 38.1 35.3*  MCV 96.7 94.6  PLT 450* 328   Cardiac Enzymes: No results found for this basename: CKTOTAL:3,CKMB:3,CKMBINDEX:3,TROPONINI:3 in the last 168 hours BNP:  Lab 12/21/11 1800 12/19/11 1730  PROBNP 1668.0* 379.5*   25-hydroxy vitamin D level low at 13  D-Dimer: No results found for this basename: DDIMER:2 in the last 168 hours CBG: No results found for this basename: GLUCAP:6 in the last 168  hours Hemoglobin A1C: No results found for this basename: HGBA1C in the last 168 hours Fasting Lipid Panel: No results found for this basename: CHOL,HDL,LDLCALC,TRIG,CHOLHDL,LDLDIRECT in the last 168 hours Thyroid Function Tests:  Lab 12/19/11 1730  TSH 4.158  T4TOTAL --  FREET4 --  T3FREE --  THYROIDAB --   Coagulation: No results found for this basename: LABPROT:4,INR:4 in the last 168 hours Anemia Panel:  Lab 12/19/11 1730  VITAMINB12 275  FOLATE --  FERRITIN --  TIBC --  IRON --  RETICCTPCT --   Urine Drug Screen: Drugs of Abuse  No results found for this basename: labopia,  cocainscrnur,  labbenz,  amphetmu,  thcu,  labbarb    Alcohol Level: No results found for this basename: ETH:2 in the last 168 hours Urinalysis: No results found for this basename: COLORURINE:2,APPERANCEUR:2,LABSPEC:2,PHURINE:2,GLUCOSEU:2,HGBUR:2,BILIRUBINUR:2,KETONESUR:2,PROTEINUR:2,UROBILINOGEN:2,NITRITE:2,LEUKOCYTESUR:2 in the last 168 hours  Micro Results: No results found for this or any previous visit (from the past 240 hour(s)). Studies/Results: Dg Chest Port 1 View  12/21/2011  *RADIOLOGY REPORT*  Clinical Data: COPD.  Short of breath.  Dyspnea.  PORTABLE CHEST - 1 VIEW  Comparison: 12/19/2011.  10/05/2009.  Findings: Bosselation of the left hemidiaphragm is present.  Small contour abnormality is present at the left  cardiophrenic angle which appears little changed compared to 10/05/2009 and probably represents either a small hiatal hernia (which was present on prior CT 05/24/2009).  Emphysema is present.  Apical lordotic projection.  No airspace disease.  Left basilar scarring and / or atelectasis is present. This appears similar to both recent comparison exam and remote comparison exam.  Cardiopericardial silhouette unchanged.  IMPRESSION: Emphysema.  No acute cardiopulmonary disease.  Stable small hiatal hernia and left basilar atelectasis/scarring.  Original Report Authenticated By: Dereck Ligas, M.D.   Scheduled Meds:    . budesonide-formoterol  2 puff Inhalation BID  . ceFEPime (MAXIPIME) IV  1 g Intravenous Q12H  . enoxaparin  30 mg Subcutaneous Q24H  . feeding supplement  1 Container Oral TID BM  . furosemide  40 mg Intravenous Once  . furosemide  20 mg Oral BID  . ipratropium  500 mcg Nebulization QID  . levalbuterol  0.63 mg Nebulization Q4H  . methylPREDNISolone (SOLU-MEDROL) injection  60 mg Intravenous Q6H  . sodium chloride      . sodium chloride      . sodium chloride      . sodium chloride      . vancomycin  1,000 mg Intravenous Q24H  . Vitamin D (Ergocalciferol)  50,000 Units Oral Q7 days  . DISCONTD: enoxaparin  40 mg Subcutaneous Q24H   Continuous Infusions:  PRN Meds:.acetaminophen, alum & mag hydroxide-simeth, levalbuterol, LORazepam, morphine injection, ondansetron (ZOFRAN) IV, ondansetron Assessment/Plan: Active Problems:  Acute bronchitis  COPD with acute exacerbation  Diarrhea, resolved  Dehydration, improved  Weakness generalized  Anemia  Anorexia  Acute renal insufficiency  Benign hypertension  Obesity  Dyspnea likely related to COPD with exacerbation and a component of fluid overload. Resume Lasix 20 mg twice daily. Await echocardiogram results. Continue current steroid and antibiotic dosing. Skilled nursing facility placement once medically more stable. More comfortable today.  Fatigue malaise and weakness likely multifactorial secondary to COPD, obesity, deconditioning. Also vitamin D deficiency can be contributing. Will start  ergoCalciferol 50,000 units weekly.   LOS: 3 days   Cheyenne Case L 12/22/2011, 2:42 PM

## 2011-12-22 NOTE — Progress Notes (Addendum)
I was made aware in report that this pt had been having SOB with wheezing a couple times throughout the day, this had been quite constant. Upon assessment at 1930, pt was wheezing and dyspneic. I continued to monitor pt closely throughout the night. Pt urinated several times after receiving the Lasix dose, and stated she had begun to feel a very little bit better. At around 2215, pt had less audible wheezing than previously, and a dose of Morphine was given. Upon reassessment at 2230, after morphine, pt was sleeping. Pt began wheezing again throughout the night, but constantly said that she felt about the same as she had all night, and that she wasn't any worse. At 0042, pt was shaky, which she said was baseline, but I administered Ativan to help her rest and decrease the shakiness. I administered morphine again at 0349, because pt was wheezing audibly and seemed to be quite SOB after ambulating to the [facility]. Upon reassessment, pt was resting well and at 0630, the audible wheezing has decreased. Cheyenne Case

## 2011-12-23 DIAGNOSIS — I2723 Pulmonary hypertension due to lung diseases and hypoxia: Secondary | ICD-10-CM | POA: Diagnosis present

## 2011-12-23 LAB — BASIC METABOLIC PANEL
BUN: 46 mg/dL — ABNORMAL HIGH (ref 6–23)
Creatinine, Ser: 1.85 mg/dL — ABNORMAL HIGH (ref 0.50–1.10)
GFR calc Af Amer: 30 mL/min — ABNORMAL LOW (ref >90)
GFR calc non Af Amer: 26 mL/min — ABNORMAL LOW (ref >90)
Potassium: 4.8 mEq/L (ref 3.5–5.1)

## 2011-12-23 MED ORDER — BIOTENE DRY MOUTH MT LIQD: 15.0000 mL | OROMUCOSAL | Status: DC | PRN

## 2011-12-23 MED ORDER — FUROSEMIDE 20 MG PO TABS
20.0000 mg | ORAL_TABLET | Freq: Every day | ORAL | Status: DC
  Administered 2011-12-24 – 2011-12-25 (×2): 20 mg via ORAL
  Filled 2011-12-23 (×2): qty 1

## 2011-12-23 MED ORDER — DEXTROSE 5 % IV SOLN
1.0000 g | INTRAVENOUS | Status: DC
  Administered 2011-12-23 – 2011-12-24 (×2): 1 g via INTRAVENOUS
  Filled 2011-12-23 (×5): qty 1

## 2011-12-23 MED ORDER — PREDNISONE 20 MG PO TABS
60.0000 mg | ORAL_TABLET | Freq: Two times a day (BID) | ORAL | Status: DC
  Administered 2011-12-23 – 2011-12-24 (×2): 60 mg via ORAL
  Filled 2011-12-23 (×3): qty 3

## 2011-12-23 NOTE — Consult Note (Signed)
ANTIBIOTIC CONSULT NOTE - INITIAL  Pharmacy Consult for Vancomycin Indication: rule out pna, COPD exacerbation  No Known Allergies  Patient Measurements: Height: _0  (160 cm) Weight: 134 lb 8 oz (61.009 kg) IBW/kg (Calculated) : 52.4   Vital Signs: Temp: 97.4 F (36.3 C) (03/27 0522) Temp src: Oral (03/27 0522) BP: 122/78 mmHg (03/27 0522) Pulse Rate: 91  (03/27 0522) Intake/Output from previous day: 03/26 0701 - 03/27 0700 In: 723 [P.O.:720; I.V.:3] Out: 950 [Urine:950] Intake/Output from this shift: Total I/O In: -  Out: 300 [Urine:300]  Labs:  [facility] 12/23/11 0450 12/21/11 1801 12/21/11 1800  WBC -- 16.5* --  HGB -- 12.2 --  PLT -- 450* --  LABCREA -- -- --  CREATININE 1.85* -- 1.44*   Estimated Creatinine Clearance: 23.1 ml/min (by C-G formula based on Cr of 1.85). No results found for this basename: VANCOTROUGH:2,VANCOPEAK:2,VANCORANDOM:2,GENTTROUGH:2,GENTPEAK:2,GENTRANDOM:2,TOBRATROUGH:2,TOBRAPEAK:2,TOBRARND:2,AMIKACINPEAK:2,AMIKACINTROU:2,AMIKACIN:2, in the last 72 hours   Microbiology: No results found for this or any previous visit (from the past 720 hour(s)).  Medical History: Past Medical History  Diagnosis Date  . Emphysema   . Hypertension   . COPD (chronic obstructive pulmonary disease)    Medications:  Scheduled:     . budesonide-formoterol  2 puff Inhalation BID  . ceFEPime (MAXIPIME) IV  1 g Intravenous Q12H  . enoxaparin  30 mg Subcutaneous Q24H  . feeding supplement  1 Container Oral TID BM  . furosemide  20 mg Oral Daily  . ipratropium  500 mcg Nebulization QID  . levalbuterol  0.63 mg Nebulization Q4H  . predniSONE  60 mg Oral BID  . vancomycin  1,000 mg Intravenous Q24H  . Vitamin D (Ergocalciferol)  50,000 Units Oral Q7 days  . DISCONTD: furosemide  20 mg Oral BID  . DISCONTD: methylPREDNISolone (SOLU-MEDROL) injection  60 mg Intravenous Q6H   Assessment: Estimated Creatinine Clearance: 23.1 ml/min (by C-G formula based on  Cr of 1.85).  Goal of Therapy:  Vancomycin trough level 15-20 mcg/ml  Plan: Vancomycin 1gm iv q24hrs Check trough 3/28. Adjust Cefepime for worsening renal function.  Biagio Quint R 12/23/2011,11:57 AM

## 2011-12-23 NOTE — Progress Notes (Signed)
Subjective: Had severe shortness of breath this morning. Better now. Complaining of tremors. Worked with physical therapy and walked a bit farther today.  Objective: Vital signs in last 24 hours: Filed Vitals:   12/22/11 2317 12/23/11 0347 12/23/11 0522 12/23/11 0728  BP:   122/78   Pulse:   91   Temp:   97.4 F (36.3 C)   TempSrc:   Oral   Resp:   22   Height:      Weight:      SpO2: 98% 95% 94% 93%   Weight change:   Intake/Output Summary (Last 24 hours) at 12/23/11 1057 Last data filed at 12/23/11 0818  Gross per 24 hour  Intake    480 ml  Output   1150 ml  Net   -670 ml   Physical Exam:  Gen.: Still with pursed lip breathing. Able to speak in short sentences. Appears comfortable when at rest and not talking Lungs: Bilateral wheezes continues but less. No rales. Breathing nonlabored. Slightly improved air movement today Cardiovascular regular rate rhythm without murmurs gallops rubs Abdomen obese soft nontender Extremities no clubbing cyanosis or 1+ edema, mild tremor  Lab Results: Basic Metabolic Panel:  Lab 73/40/37 0450 12/21/11 1800  NA 134* 138  K 4.8 5.2*  CL 100 104  CO2 27 27  GLUCOSE 151* 150*  BUN 46* 26*  CREATININE 1.85* 1.44*  CALCIUM 9.2 9.5  MG -- --  PHOS -- --   Liver Function Tests:  Lab 12/20/11 0530  AST 19  ALT 11  ALKPHOS 75  BILITOT 0.3  PROT 6.2  ALBUMIN 2.8*   No results found for this basename: LIPASE:2,AMYLASE:2 in the last 168 hours No results found for this basename: AMMONIA:2 in the last 168 hours CBC:  Lab 12/21/11 1801 12/19/11 0914  WBC 16.5* 4.6  NEUTROABS -- 3.0  HGB 12.2 11.5*  HCT 38.1 35.3*  MCV 96.7 94.6  PLT 450* 328   Cardiac Enzymes: No results found for this basename: CKTOTAL:3,CKMB:3,CKMBINDEX:3,TROPONINI:3 in the last 168 hours BNP:  Lab 12/21/11 1800 12/19/11 1730  PROBNP 1668.0* 379.5*   25-hydroxy vitamin D level low at 13  D-Dimer: No results found for this basename: DDIMER:2 in the  last 168 hours CBG: No results found for this basename: GLUCAP:6 in the last 168 hours Hemoglobin A1C: No results found for this basename: HGBA1C in the last 168 hours Fasting Lipid Panel: No results found for this basename: CHOL,HDL,LDLCALC,TRIG,CHOLHDL,LDLDIRECT in the last 168 hours Thyroid Function Tests:  Lab 12/19/11 1730  TSH 4.158  T4TOTAL --  FREET4 --  T3FREE --  THYROIDAB --   Coagulation: No results found for this basename: LABPROT:4,INR:4 in the last 168 hours Anemia Panel:  Lab 12/19/11 1730  VITAMINB12 275  FOLATE --  FERRITIN --  TIBC --  IRON --  RETICCTPCT --   Urine Drug Screen: Drugs of Abuse  No results found for this basename: labopia,  cocainscrnur,  labbenz,  amphetmu,  thcu,  labbarb    Alcohol Level: No results found for this basename: ETH:2 in the last 168 hours Urinalysis: No results found for this basename: COLORURINE:2,APPERANCEUR:2,LABSPEC:2,PHURINE:2,GLUCOSEU:2,HGBUR:2,BILIRUBINUR:2,KETONESUR:2,PROTEINUR:2,UROBILINOGEN:2,NITRITE:2,LEUKOCYTESUR:2 in the last 168 hours  Micro Results: No results found for this or any previous visit (from the past 240 hour(s)). Studies/Results: Dg Chest Port 1 View  12/21/2011  *RADIOLOGY REPORT*  Clinical Data: COPD.  Short of breath.  Dyspnea.  PORTABLE CHEST - 1 VIEW  Comparison: 12/19/2011.  10/05/2009.  Findings: Bosselation of the left  hemidiaphragm is present.  Small contour abnormality is present at the left cardiophrenic angle which appears little changed compared to 10/05/2009 and probably represents either a small hiatal hernia (which was present on prior CT 05/24/2009).  Emphysema is present.  Apical lordotic projection.  No airspace disease.  Left basilar scarring and / or atelectasis is present. This appears similar to both recent comparison exam and remote comparison exam.  Cardiopericardial silhouette unchanged.  IMPRESSION: Emphysema.  No acute cardiopulmonary disease.  Stable small hiatal hernia  and left basilar atelectasis/scarring.  Original Report Authenticated By: Dereck Ligas, M.D.   Echocardiogram Left ventricle: The cavity size was moderately reduced. There was mild concentric hypertrophy. Systolic function was hyperdynamic. The estimated ejection fraction was 75%. - Aortic valve: Mildly calcified annulus. Trileaflet. - Right ventricle: The cavity size was normal. Wall thickness was mildly increased. - Atrial septum: No defect or patent foramen ovale was identified. - Pulmonary arteries: Systolic pressure was mildly increased. PA peak pressure: 24m Hg (S).    Scheduled Meds:    . budesonide-formoterol  2 puff Inhalation BID  . ceFEPime (MAXIPIME) IV  1 g Intravenous Q12H  . enoxaparin  30 mg Subcutaneous Q24H  . feeding supplement  1 Container Oral TID BM  . furosemide  20 mg Oral Daily  . ipratropium  500 mcg Nebulization QID  . levalbuterol  0.63 mg Nebulization Q4H  . predniSONE  60 mg Oral BID  . vancomycin  1,000 mg Intravenous Q24H  . Vitamin D (Ergocalciferol)  50,000 Units Oral Q7 days  . DISCONTD: furosemide  20 mg Oral BID  . DISCONTD: methylPREDNISolone (SOLU-MEDROL) injection  60 mg Intravenous Q6H   Continuous Infusions:  PRN Meds:.acetaminophen, alum & mag hydroxide-simeth, antiseptic oral rinse, levalbuterol, LORazepam, morphine injection, ondansetron (ZOFRAN) IV, ondansetron Assessment/Plan: Active Problems:  Acute bronchitis  COPD with acute exacerbation  Diarrhea, resolved  Dehydration, improved  Weakness generalized  Anemia  Anorexia  Acute renal insufficiency  Benign hypertension  Obesity   Dyspnea likely related to COPD with exacerbation and a component of fluid overload. Very slow to improve with periods of severe dyspnea, query anxiety related. Creatinine is increased today. Will repeat BMET and proBNP tomorrow. Change Lasix to once daily. Try again to taper steroids. Continue broad-spectrum antibiotics. Patient has never had  pulmonary function tests but I suspect her COPD is quite severe. Ejection fraction is adequate. May have diastolic dysfunction.  Tremors are new, and likely related to bronchodilators and steroids. Previous efforts at weaning both unsuccessful, due to continued dyspnea and wheezing  Fatigue malaise and weakness likely multifactorial secondary to COPD, obesity, deconditioning. Also vitamin D deficiency contributing. Her ergocalciferol started during this hospitalization. Not ready for transfer to skilled nursing facility.   LOS: 4 days   Cheyenne Case 12/23/2011, 10:57 AM

## 2011-12-23 NOTE — Progress Notes (Signed)
Physical Therapy Treatment Patient Details Name: Cheyenne Case MRN: [id number] DOB: 01-Aug-1941 Today's Date: 12/23/2011  TIME: 908-942/ 1 TE 1 GT  PT Assessment/Plan  PT - Assessment/Plan Comments on Treatment Session: Pt increased gait distance again today, however needed standing rest break, leaning onto RW at 10'. O2 sats on 2L post amb was 83%. Initial O2 supine 2L 90%. Pt completed all exercises with only 2 rest breaks O2 sats 86-90% during this time. PT Goals  Acute Rehab PT Goals PT Goal: Ambulate - Progress: Progressing toward goal  PT Treatment Precautions/Restrictions  Precautions Required Braces or Orthoses: No Restrictions Weight Bearing Restrictions: No Mobility (including Balance) Bed Mobility Supine to Sit: 6: Modified independent (Device/Increase time) Sit to Supine: 6: Modified independent (Device/Increase time) Transfers Transfers: Yes Sit to Stand: 6: Modified independent (Device/Increase time) Stand to Sit: 6: Modified independent (Device/Increase time) Ambulation/Gait Ambulation/Gait: Yes Ambulation/Gait Assistance: 5: Supervision Ambulation/Gait Assistance Details (indicate cue type and reason): RW Ambulation Distance (Feet): 25 Feet (standing rest break at 10' neeeded) Assistive device: Rolling walker Stairs: No Wheelchair Mobility Wheelchair Mobility: No    Exercise  General Exercises - Upper Extremity Shoulder Horizontal ABduction: Both;10 reps General Exercises - Lower Extremity Ankle Circles/Pumps: Both;20 reps Quad Sets: Both;15 reps Gluteal Sets:  (15 reps) Short Arc Quad: Both;10 reps Long Arc Quad: 10 reps;Both Heel Slides: Both;10 reps Hip ABduction/ADduction: Both;10 reps Straight Leg Raises: Both;10 reps Toe Raises: Both;20 reps Heel Raises: Both (20 reps) End of Session PT - End of Session Equipment Utilized During Treatment: Gait belt Activity Tolerance: Patient tolerated treatment well;Treatment limited secondary to medical  complications (Comment) (dyspnea) General Behavior During Session: [facility] for tasks performed Cognition: [facility] for tasks performed  Jola Critzer, [place] 12/23/2011, 9:54 AM

## 2011-12-23 NOTE — Progress Notes (Signed)
Patient not stable for d/c per MD- working on SNF options for her. Will follow- Eduard Clos, MSW, [place]

## 2011-12-24 ENCOUNTER — Encounter (HOSPITAL_COMMUNITY): Payer: Self-pay | Admitting: Internal Medicine

## 2011-12-24 DIAGNOSIS — E559 Vitamin D deficiency, unspecified: Secondary | ICD-10-CM | POA: Diagnosis present

## 2011-12-24 DIAGNOSIS — R5381 Other malaise: Secondary | ICD-10-CM

## 2011-12-24 DIAGNOSIS — E8770 Fluid overload, unspecified: Secondary | ICD-10-CM | POA: Diagnosis not present

## 2011-12-24 HISTORY — DX: Other malaise: R53.81

## 2011-12-24 LAB — BASIC METABOLIC PANEL
Calcium: 9.1 mg/dL (ref 8.4–10.5)
Creatinine, Ser: 1.86 mg/dL — ABNORMAL HIGH (ref 0.50–1.10)
GFR calc Af Amer: 30 mL/min — ABNORMAL LOW (ref >90)

## 2011-12-24 LAB — PRO B NATRIURETIC PEPTIDE: Pro B Natriuretic peptide (BNP): 800.9 pg/mL — ABNORMAL HIGH (ref 0–125)

## 2011-12-24 MED ORDER — PREDNISONE 20 MG PO TABS
40.0000 mg | ORAL_TABLET | Freq: Two times a day (BID) | ORAL | Status: DC
  Administered 2011-12-24 – 2011-12-25 (×2): 40 mg via ORAL
  Filled 2011-12-24 (×2): qty 2

## 2011-12-24 MED ORDER — VANCOMYCIN HCL 500 MG IV SOLR
500.0000 mg | INTRAVENOUS | Status: DC
  Administered 2011-12-25: 500 mg via INTRAVENOUS
  Filled 2011-12-24 (×4): qty 500

## 2011-12-24 MED ORDER — ENSURE COMPLETE PO LIQD
237.0000 mL | Freq: Two times a day (BID) | ORAL | Status: DC
  Administered 2011-12-24 – 2011-12-25 (×2): 237 mL via ORAL

## 2011-12-24 MED ORDER — PREDNISONE 10 MG PO TABS: 45.0000 mg | ORAL_TABLET | Freq: Two times a day (BID) | ORAL | Status: DC

## 2011-12-24 NOTE — Progress Notes (Signed)
Subjective: The patient says that she is breathing better. She was less prednisone as she feels it is making her too jittery.  Objective: Vital signs in last 24 hours: Filed Vitals:   12/24/11 0345 12/24/11 0518 12/24/11 0750 12/24/11 1126  BP:  128/75    Pulse:  84    Temp:  97.3 F (36.3 C)    TempSrc:  Oral    Resp:  19    Height:      Weight:      SpO2: 98% 98% 95% 90%    Intake/Output Summary (Last 24 hours) at 12/24/11 1248 Last data filed at 12/24/11 1100  Gross per 24 hour  Intake     50 ml  Output   1225 ml  Net  -1175 ml    Weight change:   Physical exam: Lungs: Clear anteriorly and occasional crackles in the mid lobes posteriorly. Heart: S1, S2, with with no murmurs rubs or gallops. Abdomen: Positive bowel sounds, soft, nontender, nondistended. Extremities: No pedal edema. Neurologic: She is alert and oriented x3. She appears to be physically deconditioned.  Lab Results: Basic Metabolic Panel:  Basename 12/24/11 0543 12/23/11 0450  NA 136 134*  K 4.5 4.8  CL 101 100  CO2 30 27  GLUCOSE 116* 151*  BUN 49* 46*  CREATININE 1.86* 1.85*  CALCIUM 9.1 9.2  MG -- --  PHOS -- --   Liver Function Tests: No results found for this basename: AST:2,ALT:2,ALKPHOS:2,BILITOT:2,PROT:2,ALBUMIN:2 in the last 72 hours No results found for this basename: LIPASE:2,AMYLASE:2 in the last 72 hours No results found for this basename: AMMONIA:2 in the last 72 hours CBC:  Basename 12/21/11 1801  WBC 16.5*  NEUTROABS --  HGB 12.2  HCT 38.1  MCV 96.7  PLT 450*   Cardiac Enzymes: No results found for this basename: CKTOTAL:3,CKMB:3,CKMBINDEX:3,TROPONINI:3 in the last 72 hours BNP:  Basename 12/24/11 0543 12/21/11 1800  PROBNP 800.9* 1668.0*   D-Dimer: No results found for this basename: DDIMER:2 in the last 72 hours CBG: No results found for this basename: GLUCAP:6 in the last 72 hours Hemoglobin A1C: No results found for this basename: HGBA1C in the last 72  hours Fasting Lipid Panel: No results found for this basename: CHOL,HDL,LDLCALC,TRIG,CHOLHDL,LDLDIRECT in the last 72 hours Thyroid Function Tests: No results found for this basename: TSH,T4TOTAL,FREET4,T3FREE,THYROIDAB in the last 72 hours Anemia Panel: No results found for this basename: VITAMINB12,FOLATE,FERRITIN,TIBC,IRON,RETICCTPCT in the last 72 hours Coagulation: No results found for this basename: LABPROT:2,INR:2 in the last 72 hours Urine Drug Screen: Drugs of Abuse  No results found for this basename: labopia, cocainscrnur, labbenz, amphetmu, thcu, labbarb    Alcohol Level: No results found for this basename: ETH:2 in the last 72 hours Urinalysis: No results found for this basename: COLORURINE:2,APPERANCEUR:2,LABSPEC:2,PHURINE:2,GLUCOSEU:2,HGBUR:2,BILIRUBINUR:2,KETONESUR:2,PROTEINUR:2,UROBILINOGEN:2,NITRITE:2,LEUKOCYTESUR:2 in the last 72 hours Misc. Labs:   Micro: No results found for this or any previous visit (from the past 240 hour(s)).  Studies/Results: No results found.  Medications: I have reviewed the patient's current medications.  Assessment: Active Problems:  Acute bronchitis  COPD with acute exacerbation  Weakness generalized  Diarrhea  Anemia  Anorexia  Dehydration  Acute renal insufficiency  Benign hypertension  Obesity  Pulmonary hypertension due to COPD  Vitamin d deficiency  Volume overload  Physical deconditioning   1. COPD with bronchitic exacerbation. Clinically improving on antibiotics, steroids, and bronchodilator therapy.  Probable recent volume overload. She is status post IV Lasix. Her pro BNP has decreased significantly over the past 24 hours.  Steroid-induced  hyperglycemia. Improving as steroids are tapered down.  Acute renal failure. Her BUN and creatinine have increased. This was following IV Lasix. She may have underlying chronic kidney disease.  Tremors. Likely secondary to bronchodilators and steroids. When necessary  Ativan has been ordered.  Vitamin D. deficiency. Replacement therapy has been started.  Severe deconditioning. Skilled nursing facility placement is pending. It is likely that the patient will be ready for discharge tomorrow.  Plan:  1. We'll decrease/taper the steroids accordingly. 2. We'll continue to follow her electrolytes and renal function. 3. Probable discharge within the next 24-48 hours.    LOS: 5 days   Gaylord Seydel 12/24/2011, 12:48 PM

## 2011-12-24 NOTE — Progress Notes (Signed)
CARE MANAGEMENT NOTE 12/24/2011  Patient:  Cheyenne Case, Cheyenne Case   Account Number:  [id number]  Date Initiated:  12/24/2011  Documentation initiated by:  Claretha Cooper  Subjective/Objective Assessment:   pt admitted with weakness and diarrhea. Per brother she is significantly below her baseline. Has been getting progressively worse over last six months. Lives with brother.     Action/Plan:   Pt has been evaluated and skilled facility has been recommended. CSW following for placement   Anticipated DC Date:  12/28/2011   Anticipated DC Plan:  [place]  CM consult      Choice offered to / List presented to:             Status of service:  In process, will continue to follow Medicare Important Message given?   (If response is "NO", the following Medicare IM given date fields will be blank) Date Medicare IM given:   Date Additional Medicare IM given:    Discharge Disposition:    Per UR Regulation:    If discussed at Long Length of Stay Meetings, dates discussed:   12/24/2011    Comments:  12/24/11 [place] BSN CM

## 2011-12-24 NOTE — Progress Notes (Signed)
Nutrition  Pt reports decreased appetite recently. Regular diet  With observed meal intake 0-25%. Ensure pudding TID provided however, pt doesn't like the taste and has requested it be changed to  Ensure Complete. Supplement has now been changed. Plans are to d/c to [facility]. Rec she cont Ensure with d/c orders given her poor oral intake.  Dietitian [phone]

## 2011-12-24 NOTE — Progress Notes (Signed)
Physical Therapy Treatment Patient Details Name: Cheyenne Case MRN: [id number] DOB: 03/30/1941 Today's Date: 12/24/2011  TIME: 914-956/1 TE 1 GT  PT Assessment/Plan  PT - Assessment/Plan Comments on Treatment Session: O2 sats remained higher than previous sessions today, lowest 86%  2L during ambulation;otherwise remained 90-93%. However patient needed to take several rest breaks during exercise due to SOB. During gait training patient  spontaneoulsy slouched over onto RW stating " I cant breath and need to sit down" O2 sat at this point was 89%. Discussed with patient O2 levels and whether she was becoming anxious about the breathing and she feels it is not anxiety and feels the predisone make breathing worse. PT Goals  Acute Rehab PT Goals PT Goal: Ambulate - Progress: Progressing toward goal  PT Treatment Precautions/Restrictions  Precautions Required Braces or Orthoses: No Restrictions Weight Bearing Restrictions: No Mobility (including Balance) Bed Mobility Supine to Sit: 6: Modified independent (Device/Increase time) Transfers Transfers: Yes Sit to Stand: 6: Modified independent (Device/Increase time) Stand to Sit: 5: Supervision Ambulation/Gait Ambulation/Gait: Yes Ambulation/Gait Assistance: 5: Supervision Ambulation/Gait Assistance Details (indicate cue type and reason): RW Ambulation Distance (Feet): 34 Feet (24' + 10+ seated rest break needed between) Assistive device: Rolling walker Gait velocity: slow Stairs: No Wheelchair Mobility Wheelchair Mobility: No    Exercise  General Exercises - Lower Extremity Ankle Circles/Pumps: Both;20 reps Quad Sets: Both;10 reps Gluteal Sets: 10 reps Short Arc Quad: 10 reps;Both Long Arc Quad: Both;10 reps Heel Slides: Both;10 reps Hip ABduction/ADduction: 10 reps;Both Straight Leg Raises: Both;10 reps Toe Raises: Both;20 reps Heel Raises: Both (20 reps) End of Session PT - End of Session Equipment Utilized During  Treatment: Gait belt Activity Tolerance: Patient tolerated treatment well;Treatment limited secondary to medical complications (Comment) Patient left: in chair;with call bell in reach;with family/visitor present (chair alarm set) General Behavior During Session: [facility] for tasks performed Cognition: [facility] for tasks performed  Damari Hiltz, [place] 12/24/2011, 10:13 AM

## 2011-12-24 NOTE — Progress Notes (Signed)
Pt became very anxious this afternoon  Ativan 0.18ms given  Anxiety relieved

## 2011-12-24 NOTE — Consult Note (Signed)
ANTIBIOTIC CONSULT NOTE - INITIAL  Pharmacy Consult for Vancomycin Indication: rule out pna, COPD exacerbation  No Known Allergies  Patient Measurements: Height: _0  (160 cm) Weight: 134 lb 8 oz (61.009 kg) IBW/kg (Calculated) : 52.4   Vital Signs: Temp: 97.3 F (36.3 C) (03/28 0518) Temp src: Oral (03/28 0518) BP: 128/75 mmHg (03/28 0518) Pulse Rate: 84  (03/28 0518) Intake/Output from previous day: 03/27 0701 - 03/28 0700 In: 50 [IV Piggyback:50] Out: 1225 [Urine:1225] Intake/Output from this shift: Total I/O In: -  Out: 300 [Urine:300]  Labs:  Basename 12/24/11 0543 12/23/11 0450 12/21/11 1801 12/21/11 1800  WBC -- -- 16.5* --  HGB -- -- 12.2 --  PLT -- -- 450* --  LABCREA -- -- -- --  CREATININE 1.86* 1.85* -- 1.44*   Estimated Creatinine Clearance: 22.9 ml/min (by C-G formula based on Cr of 1.86).  Basename 12/24/11 1154  VANCOTROUGH 29.5*  VANCOPEAK --  Jake Michaelis --  [place] --  AMIKACIN --     Microbiology: No results found for this or any previous visit (from the past 720 hour(s)).  Medical History: Past Medical History  Diagnosis Date  . Emphysema   . Hypertension   . COPD (chronic obstructive pulmonary disease)   . Vitamin d deficiency 12/24/2011  . Physical deconditioning 12/24/2011   Medications:  Scheduled:     . budesonide-formoterol  2 puff Inhalation BID  . ceFEPime (MAXIPIME) IV  1 g Intravenous Q24H  . enoxaparin  30 mg Subcutaneous Q24H  . feeding supplement  237 mL Oral BID BM  . furosemide  20 mg Oral Daily  . ipratropium  500 mcg Nebulization QID  . levalbuterol  0.63 mg Nebulization Q4H  . predniSONE  40 mg Oral BID  . vancomycin  1,000 mg Intravenous Q24H  . Vitamin D (Ergocalciferol)  50,000 Units Oral Q7 days  . DISCONTD: feeding supplement  1 Container Oral TID BM  . DISCONTD: predniSONE  45 mg Oral BID   . DISCONTD: predniSONE  60 mg Oral BID   Assessment: Estimated Creatinine Clearance: 22.9 ml/min (by C-G formula based on Cr of 1.86). Vancomycin trough above goal 29.5  Goal of Therapy:  Vancomycin trough level 15-20 mcg/ml  Plan: Hold next dose Vancomycin Change Vancomycin to 500 mg IV every 24 hours (next dose 12/25/11 at 2 AM) Recheck trough 3/30   Charlesetta Milliron [place] 12/24/2011,1:41 PM

## 2011-12-24 NOTE — Progress Notes (Signed)
Vanc trough 29.5.  Reported to [facility] RPH  Vancomycin dose held

## 2011-12-24 NOTE — Progress Notes (Signed)
Spoke with patient this morning and she appears and states she is doing better! She is frustrated with the side effects of the medications causing her to be very shaky- but states, "I can breathe."  She denies being anxious or depressed- acknowledges feeling somewhat sad as expected with her medical issues and hospitalization. She has taken medicine in the past for anxiety- does not feel that she needs that at this time. She reports sleeping well yet her appetite has been slow to recover- asked about getting Chocolate Ensure drinks instead of the pudding as she prefers- Therapist, sports and RD to be advised of this request.  Plan at d/c is for SNF and she is agreeable to this- her brother will assist with getting her signed in to facility- [facility] will have a bed for her tomorrow or Saturday- Dr. Caryn Section advised of this.  CSW offered support and encouragement to patient- CSW to f/u tomorrow for ?SNF transfer- Eduard Clos, MSW, [place]

## 2011-12-25 ENCOUNTER — Inpatient Hospital Stay
Admission: RE | Admit: 2011-12-25 | Discharge: 2012-01-27 | Disposition: A | Discharge status: Home / Self Care | Payer: Self-pay | Source: Ambulatory Visit | Attending: Internal Medicine | Admitting: Internal Medicine

## 2011-12-25 ENCOUNTER — Encounter (HOSPITAL_COMMUNITY): Payer: Self-pay | Admitting: Internal Medicine

## 2011-12-25 DIAGNOSIS — Z5189 Encounter for other specified aftercare: Secondary | ICD-10-CM | POA: Diagnosis not present

## 2011-12-25 DIAGNOSIS — M79609 Pain in unspecified limb: Secondary | ICD-10-CM | POA: Diagnosis not present

## 2011-12-25 DIAGNOSIS — J209 Acute bronchitis, unspecified: Secondary | ICD-10-CM | POA: Diagnosis not present

## 2011-12-25 DIAGNOSIS — J441 Chronic obstructive pulmonary disease with (acute) exacerbation: Secondary | ICD-10-CM | POA: Diagnosis not present

## 2011-12-25 DIAGNOSIS — L02419 Cutaneous abscess of limb, unspecified: Secondary | ICD-10-CM | POA: Diagnosis not present

## 2011-12-25 DIAGNOSIS — R630 Anorexia: Secondary | ICD-10-CM | POA: Diagnosis not present

## 2011-12-25 DIAGNOSIS — N179 Acute kidney failure, unspecified: Secondary | ICD-10-CM | POA: Diagnosis not present

## 2011-12-25 DIAGNOSIS — R609 Edema, unspecified: Secondary | ICD-10-CM | POA: Diagnosis not present

## 2011-12-25 DIAGNOSIS — J440 Chronic obstructive pulmonary disease with acute lower respiratory infection: Secondary | ICD-10-CM | POA: Diagnosis not present

## 2011-12-25 DIAGNOSIS — I10 Essential (primary) hypertension: Secondary | ICD-10-CM | POA: Diagnosis not present

## 2011-12-25 DIAGNOSIS — D649 Anemia, unspecified: Secondary | ICD-10-CM | POA: Diagnosis not present

## 2011-12-25 DIAGNOSIS — I82409 Acute embolism and thrombosis of unspecified deep veins of unspecified lower extremity: Secondary | ICD-10-CM

## 2011-12-25 DIAGNOSIS — J449 Chronic obstructive pulmonary disease, unspecified: Secondary | ICD-10-CM | POA: Diagnosis not present

## 2011-12-25 DIAGNOSIS — J9600 Acute respiratory failure, unspecified whether with hypoxia or hypercapnia: Secondary | ICD-10-CM | POA: Diagnosis not present

## 2011-12-25 DIAGNOSIS — I509 Heart failure, unspecified: Secondary | ICD-10-CM | POA: Diagnosis not present

## 2011-12-25 DIAGNOSIS — I2789 Other specified pulmonary heart diseases: Secondary | ICD-10-CM | POA: Diagnosis not present

## 2011-12-25 DIAGNOSIS — M7989 Other specified soft tissue disorders: Secondary | ICD-10-CM | POA: Diagnosis not present

## 2011-12-25 DIAGNOSIS — R5381 Other malaise: Secondary | ICD-10-CM | POA: Diagnosis not present

## 2011-12-25 LAB — BASIC METABOLIC PANEL
BUN: 41 mg/dL — ABNORMAL HIGH (ref 6–23)
Calcium: 9.3 mg/dL (ref 8.4–10.5)
Creatinine, Ser: 1.65 mg/dL — ABNORMAL HIGH (ref 0.50–1.10)
GFR calc Af Amer: 35 mL/min — ABNORMAL LOW (ref >90)
GFR calc non Af Amer: 30 mL/min — ABNORMAL LOW (ref >90)

## 2011-12-25 MED ORDER — CLONAZEPAM 0.5 MG PO TABS
0.2500 mg | ORAL_TABLET | Freq: Two times a day (BID) | ORAL | Status: DC
  Administered 2011-12-25: 0.25 mg via ORAL
  Filled 2011-12-25: qty 1

## 2011-12-25 MED ORDER — PREDNISONE 20 MG PO TABS: ORAL_TABLET | ORAL | Status: DC

## 2011-12-25 MED ORDER — LISINOPRIL 5 MG PO TABS: 5.0000 mg | ORAL_TABLET | Freq: Every day | ORAL | Status: AC

## 2011-12-25 MED ORDER — ENSURE COMPLETE PO LIQD: 237.0000 mL | Freq: Two times a day (BID) | ORAL | Status: DC

## 2011-12-25 MED ORDER — VITAMIN D (ERGOCALCIFEROL) 1.25 MG (50000 UNIT) PO CAPS: 50000.0000 [IU] | ORAL_CAPSULE | ORAL | Status: DC

## 2011-12-25 MED ORDER — CLONAZEPAM 0.5 MG PO TABS: 0.2500 mg | ORAL_TABLET | Freq: Two times a day (BID) | ORAL | Status: DC

## 2011-12-25 MED ORDER — LEVALBUTEROL HCL 0.63 MG/3ML IN NEBU: 0.6300 mg | INHALATION_SOLUTION | RESPIRATORY_TRACT | Status: DC

## 2011-12-25 MED ORDER — LEVOFLOXACIN 500 MG PO TABS: 500.0000 mg | ORAL_TABLET | Freq: Every day | ORAL | Status: AC

## 2011-12-25 NOTE — Progress Notes (Addendum)
Physical Therapy Treatment Patient Details Name: Cheyenne Case MRN: [id number] DOB: January 18, 1941 Today's Date: 12/25/2011   TIME: 1150-1108/ 1 TE 1 GT PT Assessment/Plan  PT - Assessment/Plan Comments on Treatment Session: Pt increased gait distance today greatly. 110' total;3 standing rest breaks needed with 30' longest distance amb without resting. O2 sats on 3L remained at 95% and greater, HR highest of 144 after 30' of amb without rest. RW;Min A PT Goals  Acute Rehab PT Goals PT Goal: Ambulate - Progress: Progressing toward goal  PT Treatment Precautions/Restrictions  Precautions Required Braces or Orthoses: No Restrictions Weight Bearing Restrictions: No Mobility (including Balance) Bed Mobility Supine to Sit: 6: Modified independent (Device/Increase time) Sit to Supine: 6: Modified independent (Device/Increase time) Transfers Transfers: Yes Sit to Stand: 6: Modified independent (Device/Increase time) Stand to Sit: 5: Supervision Ambulation/Gait Ambulation/Gait: Yes Ambulation/Gait Assistance: 5: Supervision Ambulation/Gait Assistance Details (indicate cue type and reason): verbal cues for keeping RW closer and thoracic extension Ambulation Distance (Feet): 110 Feet (3 standing rest breaks needed/30' longest without rest) Assistive device: Rolling walker Gait Pattern: Trunk flexed;Step-to pattern Stairs: No Wheelchair Mobility Wheelchair Mobility: No    Exercise  General Exercises - Lower Extremity Ankle Circles/Pumps: Both;20 reps Heel Slides: Both;10 reps Hip ABduction/ADduction: 10 reps;Both Straight Leg Raises: Both;10 reps End of Session PT - End of Session Equipment Utilized During Treatment: Gait belt Activity Tolerance: Patient tolerated treatment well Patient left: with family/visitor present;in bed General Behavior During Session: [facility] for tasks performed Cognition: [facility] for tasks performed  Maddyx Vallie ATKINSO 12/25/2011, 11:25 AM

## 2011-12-25 NOTE — Progress Notes (Signed)
Clinical Social Work Department CLINICAL SOCIAL WORK PLACEMENT NOTE 12/25/2011  Patient:  Cheyenne Case, Cheyenne Case  Account Number:  [id number] Admit date:  12/19/2011  Clinical Social Worker:  Caleen Essex, LCSW  Date/time:  12/25/2011 01:40 PM  Clinical Social Work is seeking post-discharge placement for this patient at the following level of care:   SKILLED NURSING   (*CSW will update this form in Epic as items are completed)   12/25/2011  Patient/family provided with [place] Department of Clinical Social Work's list of facilities offering this level of care within the geographic area requested by the patient (or if unable, by the patient's family).  12/25/2011  Patient/family informed of their freedom to choose among providers that offer the needed level of care, that participate in Medicare, Medicaid or managed care program needed by the patient, have an available bed and are willing to accept the patient.  12/25/2011  Patient/family informed of [facility]' ownership interest in [facility], as well as of the fact that they are under no obligation to receive care at this facility.  PASARR submitted to EDS on 12/25/2011 PASARR number received from EDS on 12/25/2011  FL2 transmitted to all facilities in geographic area requested by pt/family on  12/25/2011 FL2 transmitted to all facilities within larger geographic area on   Patient informed that his/her managed care company has contracts with or will negotiate with  certain facilities, including the following:     Patient/family informed of bed offers received:  12/25/2011 Patient chooses bed at [facility] Physician recommends and patient chooses bed at  [facility]  Patient to be transferred to  on   Patient to be transferred to facility by [facility] Staff  The following physician request were entered in Epic:   Additional Comments: Patient family in room with her.  Discussed discharge with family and  all are agreeable.  hospital staff will carry patient over in wheelchair.  DC summary and all dc packet given to RN and family to take.  Facility aware and agreeable.  Caleen Essex, MSW LCSW [phone]

## 2011-12-25 NOTE — Progress Notes (Signed)
Clinical Social Work:  Patient stable for DC and ready to go to SNF: [facility].  Patient has notifed CM and MD and will prepare for dc.  Will alert family and have medical personnel to transport patient.  Will complete dc paperwork and fax all information.  Caleen Essex, MSW LCSW [phone] Coverage for AP

## 2011-12-25 NOTE — Discharge Summary (Signed)
Physician Discharge Summary  Cheyenne Case MRN: [id number] DOB/AGE: [birth year] 71 y.o.  PCP: Leonides Grills, MD, MD   Admit date: 12/19/2011 Discharge date: 12/25/2011  Discharge Diagnoses:  1. COPD with acute bronchitic exacerbation. 2. Hypoxic respiratory failure secondary to COPD. The patient will require when necessary nasal cannula oxygen to keep her oxygen saturations greater than 92%. 3. Volume overload secondary to IV fluids. The patient's ejection fraction was 75% per 2-D echocardiogram. 4. Pulmonary hypertension secondary to COPD. 5. Vitamin D deficiency. 6. Acute renal failure, superimposed on probable stage III chronic kidney disease. Her creatinine was 1.47 on admission. It increased to a high of 1.86. It was 1.65 at the time of discharge. 7. Hypertension. 8. Short-lived diarrhea. Resolved. 9. Transient hyperkalemia. Resolved. 10. Mild dilutional anemia. 11. Dehydration on admission. Resolved. 12. Generalized anorexia, secondary to severe COPD. 13. Generalized weakness/physical deconditioning. 14. Tremor, worsened with bronchodilator therapy and steroid therapy. 15. Steroid-induced hyperglycemia.    Medication List  As of 12/25/2011  1:02 PM   STOP taking these medications         doxycycline 100 MG tablet         TAKE these medications         budesonide-formoterol 160-4.5 MCG/ACT inhaler   Commonly known as: SYMBICORT   Inhale 2 puffs into the lungs 2 (two) times daily.      clonazePAM 0.5 MG tablet   Commonly known as: KLONOPIN   Take 0.5 tablets (0.25 mg total) by mouth 2 (two) times daily.      feeding supplement Liqd   Take 237 mLs by mouth 2 (two) times daily between meals.      furosemide 20 MG tablet   Commonly known as: LASIX   Take 20 mg by mouth 2 (two) times daily.      ipratropium 0.02 % nebulizer solution   Commonly known as: ATROVENT   Take 500 mcg by nebulization 4 (four) times daily as needed. For shortness of breath     levalbuterol 0.63 MG/3ML nebulizer solution   Commonly known as: XOPENEX   Take 3 mLs (0.63 mg total) by nebulization every 4 (four) hours.      levofloxacin 500 MG tablet   Commonly known as: LEVAQUIN   Take 1 tablet (500 mg total) by mouth daily. For 3 more days.      lisinopril 5 MG tablet   Commonly known as: PRINIVIL,ZESTRIL   Take 1 tablet (5 mg total) by mouth daily.      predniSONE 20 MG tablet   Commonly known as: DELTASONE   Take 2 tablets twice daily for 1 more day; then 1-1/2 tablets twice daily for 2 more days; then 1 tablet twice daily for 2 more days; then a half a tablet twice daily for 2 more days; then a half a tablet daily for 2 more days then stop.      Vitamin D (Ergocalciferol) 50000 UNITS Caps   Commonly known as: DRISDOL   Take 1 capsule (50,000 Units total) by mouth every 7 (seven) days.         ASK your doctor about these medications         albuterol (2.5 MG/3ML) 0.083% nebulizer solution   Commonly known as: PROVENTIL   Take 2.5 mg by nebulization every 4 (four) hours as needed. For shortness of breath            Discharge Condition: Improved.  Disposition: Skilled nursing facility: [facility].  Consults: None.   Significant Diagnostic Studies: Dg Chest Port 1 View  12/21/2011  *RADIOLOGY REPORT*  Clinical Data: COPD.  Short of breath.  Dyspnea.  PORTABLE CHEST - 1 VIEW  Comparison: 12/19/2011.  10/05/2009.  Findings: Bosselation of the left hemidiaphragm is present.  Small contour abnormality is present at the left cardiophrenic angle which appears little changed compared to 10/05/2009 and probably represents either a small hiatal hernia (which was present on prior CT 05/24/2009).  Emphysema is present.  Apical lordotic projection.  No airspace disease.  Left basilar scarring and / or atelectasis is present. This appears similar to both recent comparison exam and remote comparison exam.  Cardiopericardial silhouette unchanged.  IMPRESSION:  Emphysema.  No acute cardiopulmonary disease.  Stable small hiatal hernia and left basilar atelectasis/scarring.  Original Report Authenticated By: Dereck Ligas, M.D.   Dg Chest Port 1 View  12/19/2011  *RADIOLOGY REPORT*  Clinical Data: Cough.  PORTABLE CHEST - 1 VIEW  Comparison: 11/08/2011  Findings: Hyperinflation suggests COPD.  Calcification likely in the left lateral breast is not significantly changed at 1.5 cm.  Patient rotated to the right. Normal heart size.  Aortic atherosclerosis.  Artifact projects in the right paratracheal region.  There may be a small hiatal hernia. No pleural effusion or pneumothorax.  Clear lungs.  IMPRESSION: Hyperinflation/COPD. No acute superimposed process.  Mildly degraded by obliquity and artifact, projecting over the right paratracheal region.  Similar calcification injecting over the left breast, indeterminate.  Original Report Authenticated By: Areta Haber, M.D.   ECHO:Study Conclusions  - Left ventricle: The cavity size was moderately reduced. There was mild concentric hypertrophy. Systolic function was hyperdynamic. The estimated ejection fraction was 75%. - Aortic valve: Mildly calcified annulus. Trileaflet. - Right ventricle: The cavity size was normal. Wall thickness was mildly increased. - Atrial septum: No defect or patent foramen ovale was identified. - Pulmonary arteries: Systolic pressure was mildly increased. PA peak pressure: 43m Hg (S). Transthoracic echocardiography. M-mode, complete 2D, spectral Doppler, and color Doppler. Height: Height: 160cm. Height: 63in. Weight: Weight: 60.8kg. Weight: 133.7lb. Body mass index: BMI: 23.7kg/m^2. Body surface area: BSA: 1.665m. Patient status: Inpatient. Location: Bedside.      Microbiology: No results found for this or any previous visit (from the past 240 hour(s)).   Labs: Results for orders placed during the hospital encounter of 12/19/11 (from the past 48 hour(s))  BASIC  METABOLIC PANEL     Status: Abnormal   Collection Time   12/24/11  5:43 AM      Component Value Range Comment   Sodium 136  135 - 145 (mEq/L)    Potassium 4.5  3.5 - 5.1 (mEq/L)    Chloride 101  96 - 112 (mEq/L)    CO2 30  19 - 32 (mEq/L)    Glucose, Bld 116 (*) 70 - 99 (mg/dL)    BUN 49 (*) 6 - 23 (mg/dL)    Creatinine, Ser 1.86 (*) 0.50 - 1.10 (mg/dL)    Calcium 9.1  8.4 - 10.5 (mg/dL)    GFR calc non Af Amer 26 (*) >90 (mL/min)    GFR calc Af Amer 30 (*) >90 (mL/min)   PRO B NATRIURETIC PEPTIDE     Status: Abnormal   Collection Time   12/24/11  5:43 AM      Component Value Range Comment   Pro B Natriuretic peptide (BNP) 800.9 (*) 0 - 125 (pg/mL)   VANCOMYCIN, TROUGH     Status: Abnormal  Collection Time   12/24/11 11:54 AM      Component Value Range Comment   Vancomycin Tr 29.5 (*) 10.0 - 20.0 (ug/mL)   BASIC METABOLIC PANEL     Status: Abnormal   Collection Time   12/25/11  8:55 AM      Component Value Range Comment   Sodium 137  135 - 145 (mEq/L)    Potassium 4.8  3.5 - 5.1 (mEq/L)    Chloride 99  96 - 112 (mEq/L)    CO2 31  19 - 32 (mEq/L)    Glucose, Bld 166 (*) 70 - 99 (mg/dL)    BUN 41 (*) 6 - 23 (mg/dL)    Creatinine, Ser 1.65 (*) 0.50 - 1.10 (mg/dL)    Calcium 9.3  8.4 - 10.5 (mg/dL)    GFR calc non Af Amer 30 (*) >90 (mL/min)    GFR calc Af Amer 35 (*) >90 (mL/min)      HPI : The patient is a 71 year old woman with a past medical history significant for emphysema and hypertension who presented to the emergency department on March 23rd 2013 with a chief complaint of diarrhea and generalized weakness. She also complained of shortness of breath, worsening wheezing, and a cough. In the emergency department, she was noted to be hemodynamically stable and afebrile. She was oxygenating 95% on nasal cannula oxygen. Her lab data were significant for a BUN of 38, creatinine of 1.47, hemoglobin of 11.5, and a chest x-ray that revealed hyperinflation/COPD but no acute  superimposed process. her ABG on 3 L of oxygen revealed a pH of 7.35, PCO2 of 44.5, and PO2 of 67.  She was admitted for further evaluation and management.  HOSPITAL COURSE: The patient was started on IV fluid hydration. Lasix was discontinued. Lisinopril was withheld due to to renal insufficiency. Treatment for COPD with bronchitic exacerbation was started with albuterol/Atrovent nebulizations, IV Solu-Medrol, and IV Levaquin. Nasal cannula oxygen was applied and titrated to keep her oxygen saturations greater than 92%.  Symptomatic treatment was provided as well. For further evaluation, a number of studies were ordered. C. difficile PCR was ordered but apparently, it was never done. The patient's diarrhea resolved completely. her initial pro BNP was 380. Her vitamin B 12 level was within normal limits at 275. Her vitamin D 25-hydroxy was low at 13. She was started on vitamin D supplementation. Her TSH was within normal limits at 4.1. Her 2-D echocardiogram revealed mild pulmonary hypertension and preserved left ventricular systolic function with ejection fraction of 75%.  The patient became more dyspneic following hospital admission. Antibiotic therapy was empirically broadened with vancomycin and cefepime.  Her pro BNP increased from [phone]. The IV fluids were tapered down and she was started on IV Lasix for 24 hours. Following the IV Lasix, she became less short of breath. Her pro BNP improved to 800. Her chest x-ray did not reveal pulmonary edema. Following the IV Lasix, her creatinine increased to a high of 1.86. Following the discontinuation of IV Lasix and the restart of oral Lasix, her creatinine improved to 1.65 prior to discharge. It appears that the patient has an element of chronic kidney disease, likely stage III.  The patient also complained of worsening tremor. Apparently, she has a chronic tremor. Subjectively, she felt that the bronchodilators and steroids were making her tremor more.  Therefore, albuterol nebulization was discontinued in favor of Xopenex nebulization. IV Solu-Medrol was discontinued in favor of prednisone. As needed Ativan was ordered. Subsequently, twice  a day dosing of clonazepam was added. These measures seemed to help some.  The patient was found to be severely physically deconditioned. In my discussion with the patient's brother, she is sedentary at home the majority of time. Her worsening physical conditioning was likely due to COPD exacerbation, obesity, and vitamin D. deficiency. She was evaluated by the physical therapist. The physical therapist recommended short-term skilled nursing facility placement for rehabilitation. All were in agreement for the transfer to the [facility].  The patient will continue on antibiotic treatment with Levaquin for 3 more days and a slow prednisone taper. She will continue on bronchodilator therapy. She will continue on oxygen therapy as needed. Lisinopril was restarted at 5 mg daily, decreased from 20 mg twice a day. Lasix was resumed at twice a day dosing. She will need a followup metabolic panel in 3-5 days.    Discharge Exam: Blood pressure 130/81, pulse 77, temperature 97.4 F (36.3 C), temperature source Oral, resp. rate 18, height _0  (1.6 m), weight 61.009 kg (134 lb 8 oz), SpO2 94.00%.  Lungs: A few expiratory wheezes. Heart: S1, S2, with no murmurs rubs or gallops. Abdomen: Positive bowel sounds, soft, nontender, nondistended. Extremities: No pedal edema.   Discharge Orders    Future Orders Please Complete By Expires   Diet - low sodium heart healthy      Increase activity slowly         Follow-up Information    Follow up with Leonides Grills, MD .         Discharge time: 40 minutes.   Signed: Summerlynn Glauser 12/25/2011, 1:02 PM

## 2011-12-25 NOTE — Progress Notes (Signed)
Pt d/ced to [facility] at this time. Report given to nurse, Santiago Glad.

## 2011-12-25 NOTE — Consult Note (Signed)
ANTIBIOTIC CONSULT NOTE   Pharmacy Consult for Vancomycin Indication: rule out pna, COPD exacerbation  No Known Allergies  Patient Measurements: Height: 5' 3" (160 cm) Weight: 134 lb 8 oz (61.009 kg) IBW/kg (Calculated) : 52.4   Vital Signs: Temp: 97.4 F (36.3 C) (03/29 0514) Temp src: Oral (03/29 0514) BP: 130/81 mmHg (03/29 0514) Pulse Rate: 77  (03/29 0514) Intake/Output from previous day: 03/28 0701 - 03/29 0700 In: 270 [I.V.:120; IV Piggyback:150] Out: 1550 [Urine:1550] Intake/Output from this shift: Total I/O In: 100 [P.O.:100] Out: 250 [Urine:250]  Labs:  Basename 12/25/11 0855 12/24/11 0543 12/23/11 0450  WBC -- -- --  HGB -- -- --  PLT -- -- --  LABCREA -- -- --  CREATININE 1.65* 1.86* 1.85*   Estimated Creatinine Clearance: 25.9 ml/min (by C-G formula based on Cr of 1.65).  Basename 12/24/11 1154  VANCOTROUGH 29.5*  VANCOPEAK --  Jake Michaelis --  [place] --  AMIKACIN --    Microbiology: No results found for this or any previous visit (from the past 720 hour(s)).  Medical History: Past Medical History  Diagnosis Date  . Emphysema   . Hypertension   . COPD (chronic obstructive pulmonary disease)   . Vitamin d deficiency 12/24/2011  . Physical deconditioning 12/24/2011   Medications:  Scheduled:     . budesonide-formoterol  2 puff Inhalation BID  . ceFEPime (MAXIPIME) IV  1 g Intravenous Q24H  . clonazePAM  0.25 mg Oral BID  . enoxaparin  30 mg Subcutaneous Q24H  . feeding supplement  237 mL Oral BID BM  . furosemide  20 mg Oral Daily  . ipratropium  500 mcg Nebulization QID  . levalbuterol  0.63 mg Nebulization Q4H  . predniSONE  40 mg Oral BID  . vancomycin  500 mg Intravenous Q24H  . Vitamin D (Ergocalciferol)  50,000 Units Oral Q7 days  . DISCONTD: feeding supplement  1 Container Oral TID BM  . DISCONTD: predniSONE  45 mg  Oral BID  . DISCONTD: predniSONE  60 mg Oral BID  . DISCONTD: vancomycin  1,000 mg Intravenous Q24H   Assessment: Estimated Creatinine Clearance: 25.9 ml/min (by C-G formula based on Cr of 1.65).  Goal of Therapy:  Vancomycin trough level 15-20 mcg/ml  Plan: Change Vancomycin to 500 mg IV every 24 hours  Recheck trough Sunday am  Hart Robinsons A 12/25/2011,10:38 AM

## 2011-12-28 DIAGNOSIS — N179 Acute kidney failure, unspecified: Secondary | ICD-10-CM | POA: Diagnosis not present

## 2011-12-29 LAB — GLUCOSE, CAPILLARY
Glucose-Capillary: 115 mg/dL — ABNORMAL HIGH (ref 70–99)
Glucose-Capillary: 135 mg/dL — ABNORMAL HIGH (ref 70–99)
Glucose-Capillary: 94 mg/dL (ref 70–99)

## 2011-12-30 LAB — GLUCOSE, CAPILLARY
Glucose-Capillary: 109 mg/dL — ABNORMAL HIGH (ref 70–99)
Glucose-Capillary: 118 mg/dL — ABNORMAL HIGH (ref 70–99)
Glucose-Capillary: 108 mg/dL — ABNORMAL HIGH (ref 70–99)
Glucose-Capillary: 201 mg/dL — ABNORMAL HIGH (ref 70–99)
Glucose-Capillary: 75 mg/dL (ref 70–99)
Glucose-Capillary: 135 mg/dL — ABNORMAL HIGH (ref 70–99)

## 2011-12-31 LAB — GLUCOSE, CAPILLARY
Glucose-Capillary: 134 mg/dL — ABNORMAL HIGH (ref 70–99)
Glucose-Capillary: 115 mg/dL — ABNORMAL HIGH (ref 70–99)
Glucose-Capillary: 184 mg/dL — ABNORMAL HIGH (ref 70–99)

## 2012-01-01 LAB — GLUCOSE, CAPILLARY
Glucose-Capillary: 174 mg/dL — ABNORMAL HIGH (ref 70–99)
Glucose-Capillary: 170 mg/dL — ABNORMAL HIGH (ref 70–99)
Glucose-Capillary: 132 mg/dL — ABNORMAL HIGH (ref 70–99)
Glucose-Capillary: 98 mg/dL (ref 70–99)
Glucose-Capillary: 111 mg/dL — ABNORMAL HIGH (ref 70–99)

## 2012-01-02 LAB — GLUCOSE, CAPILLARY
Glucose-Capillary: 124 mg/dL — ABNORMAL HIGH (ref 70–99)
Glucose-Capillary: 168 mg/dL — ABNORMAL HIGH (ref 70–99)
Glucose-Capillary: 140 mg/dL — ABNORMAL HIGH (ref 70–99)

## 2012-01-03 LAB — GLUCOSE, CAPILLARY
Glucose-Capillary: 91 mg/dL (ref 70–99)
Glucose-Capillary: 96 mg/dL (ref 70–99)

## 2012-01-04 DIAGNOSIS — N179 Acute kidney failure, unspecified: Secondary | ICD-10-CM | POA: Diagnosis not present

## 2012-01-04 DIAGNOSIS — I509 Heart failure, unspecified: Secondary | ICD-10-CM | POA: Diagnosis not present

## 2012-01-04 DIAGNOSIS — J441 Chronic obstructive pulmonary disease with (acute) exacerbation: Secondary | ICD-10-CM | POA: Diagnosis not present

## 2012-01-05 ENCOUNTER — Ambulatory Visit (HOSPITAL_COMMUNITY)
Admit: 2012-01-05 | Discharge: 2012-01-05 | Disposition: A | Discharge status: Home / Self Care | Payer: Medicare Other | Attending: Internal Medicine | Admitting: Internal Medicine

## 2012-01-05 DIAGNOSIS — J209 Acute bronchitis, unspecified: Secondary | ICD-10-CM | POA: Insufficient documentation

## 2012-01-05 DIAGNOSIS — J440 Chronic obstructive pulmonary disease with acute lower respiratory infection: Secondary | ICD-10-CM | POA: Insufficient documentation

## 2012-01-05 LAB — BLOOD GAS, ARTERIAL
Acid-Base Excess: 5.9 mmol/L — ABNORMAL HIGH (ref 0.0–2.0)
Bicarbonate: 30.1 mEq/L — ABNORMAL HIGH (ref 20.0–24.0)
TCO2: 27.2 mmol/L (ref 0–100)
pCO2 arterial: 44.9 mmHg (ref 35.0–45.0)
pH, Arterial: 7.441 — ABNORMAL HIGH (ref 7.350–7.400)
pO2, Arterial: 69.7 mmHg — ABNORMAL LOW (ref 80.0–100.0)

## 2012-01-06 DIAGNOSIS — N179 Acute kidney failure, unspecified: Secondary | ICD-10-CM | POA: Diagnosis not present

## 2012-01-06 DIAGNOSIS — J441 Chronic obstructive pulmonary disease with (acute) exacerbation: Secondary | ICD-10-CM | POA: Diagnosis not present

## 2012-01-13 DIAGNOSIS — J441 Chronic obstructive pulmonary disease with (acute) exacerbation: Secondary | ICD-10-CM | POA: Diagnosis not present

## 2012-01-18 DIAGNOSIS — R609 Edema, unspecified: Secondary | ICD-10-CM | POA: Diagnosis not present

## 2012-01-19 ENCOUNTER — Ambulatory Visit (HOSPITAL_COMMUNITY)
Admit: 2012-01-19 | Discharge: 2012-01-19 | Disposition: A | Discharge status: Skilled Nursing Facility | Payer: Medicare Other | Source: Skilled Nursing Facility | Attending: Internal Medicine | Admitting: Internal Medicine

## 2012-01-19 DIAGNOSIS — M7989 Other specified soft tissue disorders: Secondary | ICD-10-CM | POA: Diagnosis not present

## 2012-01-19 DIAGNOSIS — M79609 Pain in unspecified limb: Secondary | ICD-10-CM | POA: Insufficient documentation

## 2012-01-20 DIAGNOSIS — I509 Heart failure, unspecified: Secondary | ICD-10-CM | POA: Diagnosis not present

## 2012-01-20 DIAGNOSIS — L03119 Cellulitis of unspecified part of limb: Secondary | ICD-10-CM | POA: Diagnosis not present

## 2012-01-20 DIAGNOSIS — J441 Chronic obstructive pulmonary disease with (acute) exacerbation: Secondary | ICD-10-CM | POA: Diagnosis not present

## 2012-01-21 DIAGNOSIS — D649 Anemia, unspecified: Secondary | ICD-10-CM | POA: Diagnosis not present

## 2012-01-25 ENCOUNTER — Ambulatory Visit (HOSPITAL_COMMUNITY)
Admit: 2012-01-25 | Discharge: 2012-01-25 | Disposition: A | Discharge status: Home / Self Care | Payer: Medicare Other | Source: Skilled Nursing Facility | Attending: Internal Medicine | Admitting: Internal Medicine

## 2012-01-25 DIAGNOSIS — I509 Heart failure, unspecified: Secondary | ICD-10-CM | POA: Diagnosis not present

## 2012-01-25 DIAGNOSIS — J441 Chronic obstructive pulmonary disease with (acute) exacerbation: Secondary | ICD-10-CM | POA: Diagnosis not present

## 2012-01-25 DIAGNOSIS — M79609 Pain in unspecified limb: Secondary | ICD-10-CM | POA: Insufficient documentation

## 2012-01-25 DIAGNOSIS — M7989 Other specified soft tissue disorders: Secondary | ICD-10-CM | POA: Insufficient documentation

## 2012-01-26 DIAGNOSIS — J441 Chronic obstructive pulmonary disease with (acute) exacerbation: Secondary | ICD-10-CM | POA: Diagnosis not present

## 2012-01-26 DIAGNOSIS — N179 Acute kidney failure, unspecified: Secondary | ICD-10-CM | POA: Diagnosis not present

## 2012-01-29 DIAGNOSIS — J441 Chronic obstructive pulmonary disease with (acute) exacerbation: Secondary | ICD-10-CM | POA: Diagnosis not present

## 2012-01-29 DIAGNOSIS — D649 Anemia, unspecified: Secondary | ICD-10-CM | POA: Diagnosis not present

## 2012-01-29 DIAGNOSIS — I10 Essential (primary) hypertension: Secondary | ICD-10-CM | POA: Diagnosis not present

## 2012-01-29 DIAGNOSIS — I2789 Other specified pulmonary heart diseases: Secondary | ICD-10-CM | POA: Diagnosis not present

## 2012-02-01 DIAGNOSIS — D649 Anemia, unspecified: Secondary | ICD-10-CM | POA: Diagnosis not present

## 2012-02-01 DIAGNOSIS — I2789 Other specified pulmonary heart diseases: Secondary | ICD-10-CM | POA: Diagnosis not present

## 2012-02-01 DIAGNOSIS — J441 Chronic obstructive pulmonary disease with (acute) exacerbation: Secondary | ICD-10-CM | POA: Diagnosis not present

## 2012-02-01 DIAGNOSIS — I10 Essential (primary) hypertension: Secondary | ICD-10-CM | POA: Diagnosis not present

## 2012-02-02 DIAGNOSIS — J441 Chronic obstructive pulmonary disease with (acute) exacerbation: Secondary | ICD-10-CM | POA: Diagnosis not present

## 2012-02-02 DIAGNOSIS — I10 Essential (primary) hypertension: Secondary | ICD-10-CM | POA: Diagnosis not present

## 2012-02-02 DIAGNOSIS — I2789 Other specified pulmonary heart diseases: Secondary | ICD-10-CM | POA: Diagnosis not present

## 2012-02-02 DIAGNOSIS — D649 Anemia, unspecified: Secondary | ICD-10-CM | POA: Diagnosis not present

## 2012-02-03 DIAGNOSIS — J449 Chronic obstructive pulmonary disease, unspecified: Secondary | ICD-10-CM | POA: Diagnosis not present

## 2012-02-03 DIAGNOSIS — J209 Acute bronchitis, unspecified: Secondary | ICD-10-CM | POA: Diagnosis not present

## 2012-02-03 DIAGNOSIS — IMO0002 Reserved for concepts with insufficient information to code with codable children: Secondary | ICD-10-CM | POA: Diagnosis not present

## 2012-02-04 DIAGNOSIS — D649 Anemia, unspecified: Secondary | ICD-10-CM | POA: Diagnosis not present

## 2012-02-04 DIAGNOSIS — J441 Chronic obstructive pulmonary disease with (acute) exacerbation: Secondary | ICD-10-CM | POA: Diagnosis not present

## 2012-02-04 DIAGNOSIS — I10 Essential (primary) hypertension: Secondary | ICD-10-CM | POA: Diagnosis not present

## 2012-02-04 DIAGNOSIS — I2789 Other specified pulmonary heart diseases: Secondary | ICD-10-CM | POA: Diagnosis not present

## 2012-02-10 DIAGNOSIS — J441 Chronic obstructive pulmonary disease with (acute) exacerbation: Secondary | ICD-10-CM | POA: Diagnosis not present

## 2012-02-10 DIAGNOSIS — D649 Anemia, unspecified: Secondary | ICD-10-CM | POA: Diagnosis not present

## 2012-02-10 DIAGNOSIS — I2789 Other specified pulmonary heart diseases: Secondary | ICD-10-CM | POA: Diagnosis not present

## 2012-02-10 DIAGNOSIS — I10 Essential (primary) hypertension: Secondary | ICD-10-CM | POA: Diagnosis not present

## 2012-02-11 DIAGNOSIS — J441 Chronic obstructive pulmonary disease with (acute) exacerbation: Secondary | ICD-10-CM | POA: Diagnosis not present

## 2012-02-11 DIAGNOSIS — D649 Anemia, unspecified: Secondary | ICD-10-CM | POA: Diagnosis not present

## 2012-02-11 DIAGNOSIS — I2789 Other specified pulmonary heart diseases: Secondary | ICD-10-CM | POA: Diagnosis not present

## 2012-02-11 DIAGNOSIS — I10 Essential (primary) hypertension: Secondary | ICD-10-CM | POA: Diagnosis not present

## 2012-03-03 DIAGNOSIS — J441 Chronic obstructive pulmonary disease with (acute) exacerbation: Secondary | ICD-10-CM | POA: Diagnosis not present

## 2012-03-03 DIAGNOSIS — I10 Essential (primary) hypertension: Secondary | ICD-10-CM | POA: Diagnosis not present

## 2012-03-03 DIAGNOSIS — D649 Anemia, unspecified: Secondary | ICD-10-CM | POA: Diagnosis not present

## 2012-03-03 DIAGNOSIS — I2789 Other specified pulmonary heart diseases: Secondary | ICD-10-CM | POA: Diagnosis not present

## 2012-03-23 DIAGNOSIS — D649 Anemia, unspecified: Secondary | ICD-10-CM | POA: Diagnosis not present

## 2012-03-23 DIAGNOSIS — I10 Essential (primary) hypertension: Secondary | ICD-10-CM | POA: Diagnosis not present

## 2012-03-23 DIAGNOSIS — I2789 Other specified pulmonary heart diseases: Secondary | ICD-10-CM | POA: Diagnosis not present

## 2012-03-23 DIAGNOSIS — J441 Chronic obstructive pulmonary disease with (acute) exacerbation: Secondary | ICD-10-CM | POA: Diagnosis not present

## 2012-07-06 DIAGNOSIS — Z23 Encounter for immunization: Secondary | ICD-10-CM | POA: Diagnosis not present

## 2012-07-12 IMAGING — CR DG CHEST 1V PORT
1 series · 1 of 1 positions shown · non-contrast
Comparison: 11/08/2011

CLINICAL DATA: Cough.

PORTABLE CHEST - 1 VIEW

[view not recorded]
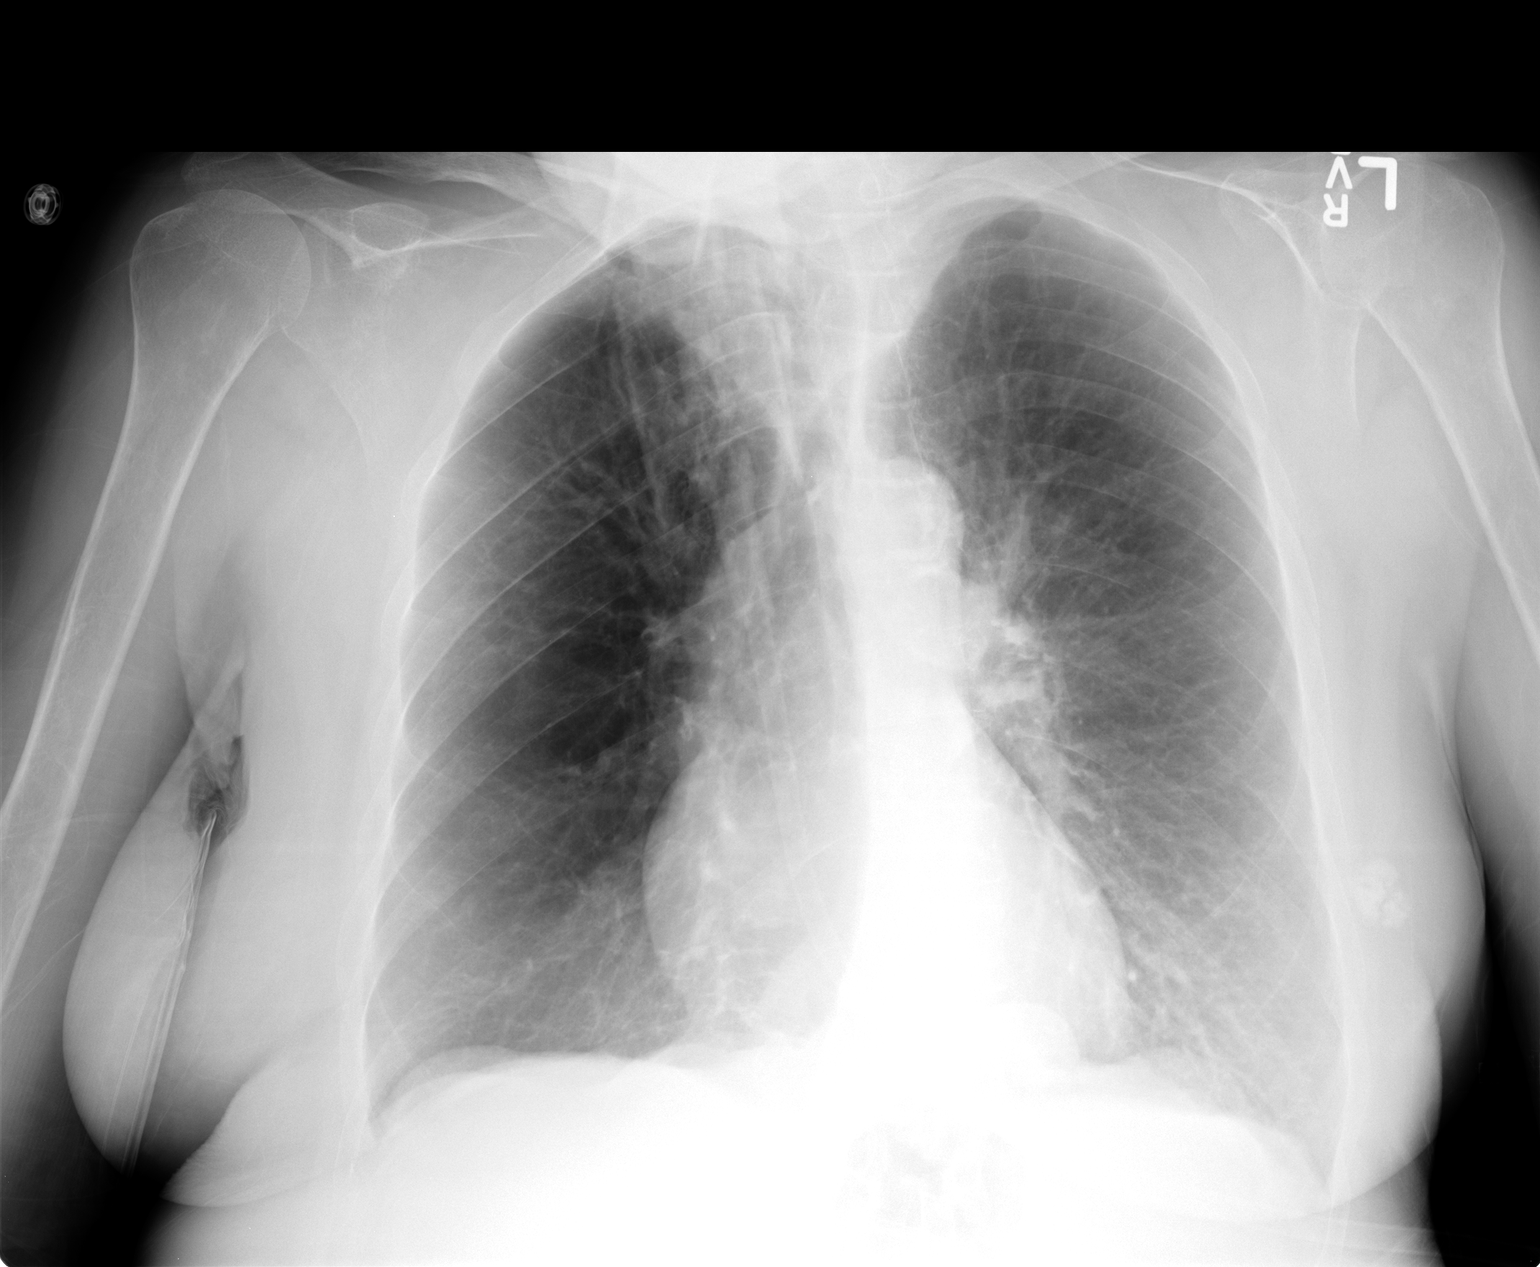

[1 of 1 positions shown; findings below may reference images not displayed]

FINDINGS: Hyperinflation suggests COPD.

Calcification likely in the left lateral breast is not
significantly changed at 1.5 cm.

Patient rotated to the right. Normal heart size.  Aortic
atherosclerosis.  Artifact projects in the right paratracheal
region.  There may be a small hiatal hernia. No pleural effusion or
pneumothorax.  Clear lungs.
IMPRESSION: Hyperinflation/COPD. No acute superimposed process.

Mildly degraded by obliquity and artifact, projecting over the
right paratracheal region.

Similar calcification injecting over the left breast,
indeterminate.

## 2012-07-14 IMAGING — CR DG CHEST 1V PORT
1 series · 1 of 1 positions shown · non-contrast
Comparison: 12/19/2011.  10/05/2009.

CLINICAL DATA: COPD.  Short of breath.  Dyspnea.

PORTABLE CHEST - 1 VIEW

[view not recorded]
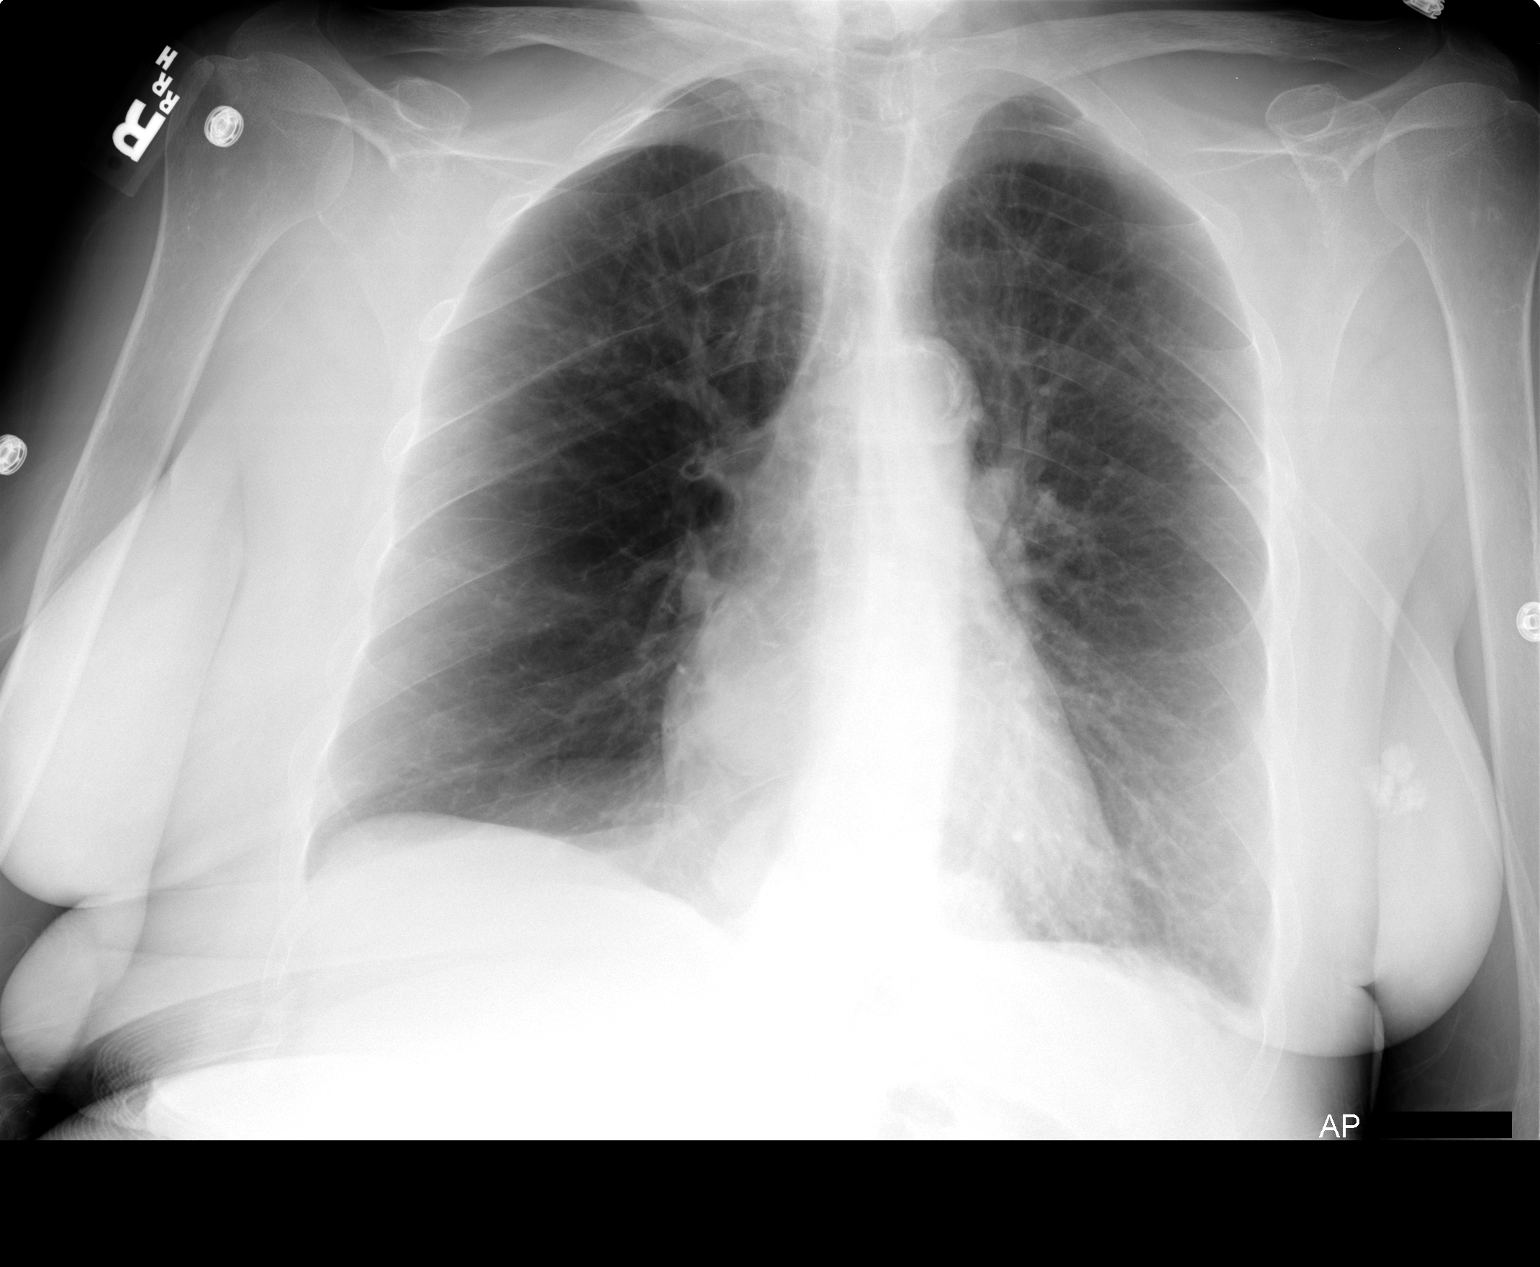

[1 of 1 positions shown; findings below may reference images not displayed]

FINDINGS: Bosselation of the left hemidiaphragm is present.  Small
contour abnormality is present at the left cardiophrenic angle
which appears little changed compared to 10/05/2009 and probably
represents either a small hiatal hernia (which was present on prior
CT 05/24/2009).

Emphysema is present.  Apical lordotic projection.  No airspace
disease.  Left basilar scarring and / or atelectasis is present.
This appears similar to both recent comparison exam and remote
comparison exam.  Cardiopericardial silhouette unchanged.
IMPRESSION: Emphysema.  No acute cardiopulmonary disease.  Stable small hiatal
hernia and left basilar atelectasis/scarring.

## 2013-03-16 DIAGNOSIS — IMO0002 Reserved for concepts with insufficient information to code with codable children: Secondary | ICD-10-CM | POA: Diagnosis not present

## 2013-03-16 DIAGNOSIS — I10 Essential (primary) hypertension: Secondary | ICD-10-CM | POA: Diagnosis not present

## 2013-03-16 DIAGNOSIS — J449 Chronic obstructive pulmonary disease, unspecified: Secondary | ICD-10-CM | POA: Diagnosis not present

## 2013-03-16 DIAGNOSIS — E039 Hypothyroidism, unspecified: Secondary | ICD-10-CM | POA: Diagnosis not present

## 2013-07-04 DIAGNOSIS — Z23 Encounter for immunization: Secondary | ICD-10-CM | POA: Diagnosis not present

## 2014-07-02 DIAGNOSIS — Z23 Encounter for immunization: Secondary | ICD-10-CM | POA: Diagnosis not present

## 2014-09-13 DIAGNOSIS — Z23 Encounter for immunization: Secondary | ICD-10-CM | POA: Diagnosis not present

## 2014-09-13 DIAGNOSIS — Z6821 Body mass index (BMI) 21.0-21.9, adult: Secondary | ICD-10-CM | POA: Diagnosis not present

## 2014-09-13 DIAGNOSIS — J449 Chronic obstructive pulmonary disease, unspecified: Secondary | ICD-10-CM | POA: Diagnosis not present

## 2015-06-07 DIAGNOSIS — J449 Chronic obstructive pulmonary disease, unspecified: Secondary | ICD-10-CM | POA: Diagnosis not present

## 2015-06-07 DIAGNOSIS — Z1389 Encounter for screening for other disorder: Secondary | ICD-10-CM | POA: Diagnosis not present

## 2015-06-07 DIAGNOSIS — Z6821 Body mass index (BMI) 21.0-21.9, adult: Secondary | ICD-10-CM | POA: Diagnosis not present

## 2015-06-07 DIAGNOSIS — Z23 Encounter for immunization: Secondary | ICD-10-CM | POA: Diagnosis not present

## 2015-12-30 DIAGNOSIS — Z6821 Body mass index (BMI) 21.0-21.9, adult: Secondary | ICD-10-CM | POA: Diagnosis not present

## 2015-12-30 DIAGNOSIS — I10 Essential (primary) hypertension: Secondary | ICD-10-CM | POA: Diagnosis not present

## 2015-12-30 DIAGNOSIS — Z0000 Encounter for general adult medical examination without abnormal findings: Secondary | ICD-10-CM | POA: Diagnosis not present

## 2015-12-30 DIAGNOSIS — Z1389 Encounter for screening for other disorder: Secondary | ICD-10-CM | POA: Diagnosis not present

## 2016-07-07 DIAGNOSIS — Z23 Encounter for immunization: Secondary | ICD-10-CM | POA: Diagnosis not present

## 2016-10-09 DIAGNOSIS — J069 Acute upper respiratory infection, unspecified: Secondary | ICD-10-CM | POA: Diagnosis not present

## 2016-10-09 DIAGNOSIS — R070 Pain in throat: Secondary | ICD-10-CM | POA: Diagnosis not present

## 2016-10-09 DIAGNOSIS — J449 Chronic obstructive pulmonary disease, unspecified: Secondary | ICD-10-CM | POA: Diagnosis not present

## 2016-10-09 DIAGNOSIS — R062 Wheezing: Secondary | ICD-10-CM | POA: Diagnosis not present

## 2016-10-09 DIAGNOSIS — Z6821 Body mass index (BMI) 21.0-21.9, adult: Secondary | ICD-10-CM | POA: Diagnosis not present

## 2016-10-09 DIAGNOSIS — R0602 Shortness of breath: Secondary | ICD-10-CM | POA: Diagnosis not present

## 2016-10-09 DIAGNOSIS — J209 Acute bronchitis, unspecified: Secondary | ICD-10-CM | POA: Diagnosis not present

## 2016-10-09 DIAGNOSIS — J343 Hypertrophy of nasal turbinates: Secondary | ICD-10-CM | POA: Diagnosis not present

## 2016-10-09 DIAGNOSIS — Z1389 Encounter for screening for other disorder: Secondary | ICD-10-CM | POA: Diagnosis not present

## 2016-11-03 ENCOUNTER — Emergency Department (HOSPITAL_COMMUNITY): Payer: Medicare Other

## 2016-11-03 ENCOUNTER — Inpatient Hospital Stay (HOSPITAL_COMMUNITY)
Admission: EM | Admit: 2016-11-03 | Discharge: 2016-11-08 | DRG: 191 | Disposition: A | Discharge status: Home Health | Payer: Medicare Other | Attending: Family Medicine | Admitting: Family Medicine

## 2016-11-03 ENCOUNTER — Encounter (HOSPITAL_COMMUNITY): Payer: Self-pay | Admitting: Emergency Medicine

## 2016-11-03 DIAGNOSIS — J440 Chronic obstructive pulmonary disease with acute lower respiratory infection: Secondary | ICD-10-CM

## 2016-11-03 DIAGNOSIS — C3410 Malignant neoplasm of upper lobe, unspecified bronchus or lung: Secondary | ICD-10-CM | POA: Diagnosis not present

## 2016-11-03 DIAGNOSIS — T380X5A Adverse effect of glucocorticoids and synthetic analogues, initial encounter: Secondary | ICD-10-CM | POA: Diagnosis present

## 2016-11-03 DIAGNOSIS — J441 Chronic obstructive pulmonary disease with (acute) exacerbation: Principal | ICD-10-CM | POA: Diagnosis present

## 2016-11-03 DIAGNOSIS — N183 Chronic kidney disease, stage 3 (moderate): Secondary | ICD-10-CM | POA: Diagnosis present

## 2016-11-03 DIAGNOSIS — C3411 Malignant neoplasm of upper lobe, right bronchus or lung: Secondary | ICD-10-CM | POA: Diagnosis not present

## 2016-11-03 DIAGNOSIS — Z8249 Family history of ischemic heart disease and other diseases of the circulatory system: Secondary | ICD-10-CM | POA: Diagnosis not present

## 2016-11-03 DIAGNOSIS — J439 Emphysema, unspecified: Secondary | ICD-10-CM | POA: Diagnosis present

## 2016-11-03 DIAGNOSIS — Z66 Do not resuscitate: Secondary | ICD-10-CM | POA: Diagnosis present

## 2016-11-03 DIAGNOSIS — J988 Other specified respiratory disorders: Secondary | ICD-10-CM | POA: Diagnosis not present

## 2016-11-03 DIAGNOSIS — C3490 Malignant neoplasm of unspecified part of unspecified bronchus or lung: Secondary | ICD-10-CM | POA: Diagnosis present

## 2016-11-03 DIAGNOSIS — Z833 Family history of diabetes mellitus: Secondary | ICD-10-CM

## 2016-11-03 DIAGNOSIS — I272 Pulmonary hypertension, unspecified: Secondary | ICD-10-CM | POA: Diagnosis present

## 2016-11-03 DIAGNOSIS — R918 Other nonspecific abnormal finding of lung field: Secondary | ICD-10-CM

## 2016-11-03 DIAGNOSIS — Z7189 Other specified counseling: Secondary | ICD-10-CM | POA: Diagnosis not present

## 2016-11-03 DIAGNOSIS — F17210 Nicotine dependence, cigarettes, uncomplicated: Secondary | ICD-10-CM | POA: Diagnosis present

## 2016-11-03 DIAGNOSIS — M6281 Muscle weakness (generalized): Secondary | ICD-10-CM | POA: Diagnosis not present

## 2016-11-03 DIAGNOSIS — F411 Generalized anxiety disorder: Secondary | ICD-10-CM | POA: Diagnosis not present

## 2016-11-03 DIAGNOSIS — Z515 Encounter for palliative care: Secondary | ICD-10-CM

## 2016-11-03 DIAGNOSIS — J449 Chronic obstructive pulmonary disease, unspecified: Secondary | ICD-10-CM | POA: Diagnosis present

## 2016-11-03 DIAGNOSIS — Z79899 Other long term (current) drug therapy: Secondary | ICD-10-CM | POA: Diagnosis not present

## 2016-11-03 DIAGNOSIS — R911 Solitary pulmonary nodule: Secondary | ICD-10-CM | POA: Diagnosis not present

## 2016-11-03 DIAGNOSIS — Z87891 Personal history of nicotine dependence: Secondary | ICD-10-CM | POA: Diagnosis not present

## 2016-11-03 DIAGNOSIS — R0602 Shortness of breath: Secondary | ICD-10-CM | POA: Diagnosis not present

## 2016-11-03 LAB — CBC WITH DIFFERENTIAL/PLATELET
Basophils Absolute: 0 10*3/uL (ref 0.0–0.1)
Basophils Relative: 0 %
Eosinophils Absolute: 0.2 10*3/uL (ref 0.0–0.7)
Eosinophils Relative: 2 %
HCT: 40.8 % (ref 36.0–46.0)
Hemoglobin: 13.3 g/dL (ref 12.0–15.0)
Lymphocytes Relative: 15 %
Lymphs Abs: 1.3 10*3/uL (ref 0.7–4.0)
MCH: 32.3 pg (ref 26.0–34.0)
MCHC: 32.6 g/dL (ref 30.0–36.0)
MCV: 99 fL (ref 78.0–100.0)
Monocytes Absolute: 0.6 10*3/uL (ref 0.1–1.0)
Monocytes Relative: 7 %
Neutro Abs: 6.6 10*3/uL (ref 1.7–7.7)
Neutrophils Relative %: 76 %
Platelets: 477 10*3/uL — ABNORMAL HIGH (ref 150–400)
RBC: 4.12 MIL/uL (ref 3.87–5.11)
RDW: 13.6 % (ref 11.5–15.5)
WBC: 8.7 10*3/uL (ref 4.0–10.5)

## 2016-11-03 LAB — BASIC METABOLIC PANEL
Anion gap: 9 (ref 5–15)
BUN: 35 mg/dL — ABNORMAL HIGH (ref 6–20)
CO2: 29 mmol/L (ref 22–32)
Calcium: 9 mg/dL (ref 8.9–10.3)
Chloride: 99 mmol/L — ABNORMAL LOW (ref 101–111)
Creatinine, Ser: 1.36 mg/dL — ABNORMAL HIGH (ref 0.44–1.00)
GFR calc Af Amer: 43 mL/min — ABNORMAL LOW (ref >60)
GFR calc non Af Amer: 37 mL/min — ABNORMAL LOW (ref >60)
Glucose, Bld: 182 mg/dL — ABNORMAL HIGH (ref 65–99)
Potassium: 3.6 mmol/L (ref 3.5–5.1)
Sodium: 137 mmol/L (ref 135–145)

## 2016-11-03 LAB — TROPONIN I: Troponin I: <0.03 ng/mL (ref <0.03)

## 2016-11-03 LAB — BRAIN NATRIURETIC PEPTIDE: B Natriuretic Peptide: 97 pg/mL (ref 0.0–100.0)

## 2016-11-03 MED ORDER — PREDNISONE 20 MG PO TABS
60.0000 mg | ORAL_TABLET | Freq: Every day | ORAL | Status: AC
  Administered 2016-11-03 – 2016-11-06 (×4): 60 mg via ORAL
  Filled 2016-11-03 (×4): qty 3
  Filled 2016-11-03: qty 1

## 2016-11-03 MED ORDER — IOPAMIDOL (ISOVUE-300) INJECTION 61%
75.0000 mL | Freq: Once | INTRAVENOUS | Status: AC | PRN
  Administered 2016-11-03: 75 mL via INTRAVENOUS

## 2016-11-03 MED ORDER — ENOXAPARIN SODIUM 40 MG/0.4ML ~~LOC~~ SOLN
40.0000 mg | SUBCUTANEOUS | Status: DC
  Administered 2016-11-03: 40 mg via SUBCUTANEOUS
  Filled 2016-11-03: qty 0.4

## 2016-11-03 MED ORDER — IPRATROPIUM BROMIDE 0.02 % IN SOLN
0.5000 mg | Freq: Three times a day (TID) | RESPIRATORY_TRACT | Status: DC
  Administered 2016-11-04 (×3): 0.5 mg via RESPIRATORY_TRACT
  Filled 2016-11-03 (×3): qty 2.5

## 2016-11-03 MED ORDER — ALBUTEROL SULFATE (2.5 MG/3ML) 0.083% IN NEBU
5.0000 mg | INHALATION_SOLUTION | Freq: Once | RESPIRATORY_TRACT | Status: AC
  Administered 2016-11-03: 5 mg via RESPIRATORY_TRACT
  Filled 2016-11-03: qty 6

## 2016-11-03 MED ORDER — TIOTROPIUM BROMIDE MONOHYDRATE 18 MCG IN CAPS
18.0000 ug | ORAL_CAPSULE | Freq: Every day | RESPIRATORY_TRACT | Status: DC
  Administered 2016-11-05 – 2016-11-08 (×4): 18 ug via RESPIRATORY_TRACT
  Filled 2016-11-03 (×2): qty 5

## 2016-11-03 MED ORDER — IPRATROPIUM BROMIDE 0.02 % IN SOLN
0.5000 mg | Freq: Four times a day (QID) | RESPIRATORY_TRACT | Status: DC
  Administered 2016-11-03: 0.5 mg via RESPIRATORY_TRACT
  Filled 2016-11-03: qty 2.5

## 2016-11-03 MED ORDER — AZITHROMYCIN 250 MG PO TABS
500.0000 mg | ORAL_TABLET | Freq: Every day | ORAL | Status: DC
Start: 2016-11-03 — End: 2016-11-07
  Administered 2016-11-03 – 2016-11-06 (×4): 500 mg via ORAL
  Filled 2016-11-03 (×5): qty 2

## 2016-11-03 MED ORDER — ORAL CARE MOUTH RINSE
15.0000 mL | Freq: Two times a day (BID) | OROMUCOSAL | Status: DC
  Administered 2016-11-03 – 2016-11-08 (×8): 15 mL via OROMUCOSAL

## 2016-11-03 MED ORDER — METHYLPREDNISOLONE SODIUM SUCC 125 MG IJ SOLR
125.0000 mg | Freq: Once | INTRAMUSCULAR | Status: AC
  Administered 2016-11-03: 125 mg via INTRAVENOUS
  Filled 2016-11-03: qty 2

## 2016-11-03 MED ORDER — GUAIFENESIN ER 600 MG PO TB12
600.0000 mg | ORAL_TABLET | Freq: Two times a day (BID) | ORAL | Status: DC
  Administered 2016-11-03 – 2016-11-04 (×3): 600 mg via ORAL
  Filled 2016-11-03 (×5): qty 1

## 2016-11-03 MED ORDER — SODIUM CHLORIDE 0.9% FLUSH
3.0000 mL | Freq: Two times a day (BID) | INTRAVENOUS | Status: DC
  Administered 2016-11-06 – 2016-11-08 (×4): 3 mL via INTRAVENOUS

## 2016-11-03 MED ORDER — ALBUTEROL SULFATE (2.5 MG/3ML) 0.083% IN NEBU
2.5000 mg | INHALATION_SOLUTION | RESPIRATORY_TRACT | Status: AC | PRN
  Administered 2016-11-03 – 2016-11-04 (×2): 2.5 mg via RESPIRATORY_TRACT
  Filled 2016-11-03 (×3): qty 3

## 2016-11-03 MED ORDER — LISINOPRIL 5 MG PO TABS
5.0000 mg | ORAL_TABLET | Freq: Every day | ORAL | Status: DC
  Administered 2016-11-03 – 2016-11-08 (×5): 5 mg via ORAL
  Filled 2016-11-03 (×6): qty 1

## 2016-11-03 MED ORDER — SODIUM CHLORIDE 0.9 % IV SOLN
INTRAVENOUS | Status: DC
  Administered 2016-11-03 – 2016-11-05 (×3): via INTRAVENOUS

## 2016-11-03 MED ORDER — ONDANSETRON HCL 4 MG/2ML IJ SOLN: 4.0000 mg | Freq: Three times a day (TID) | INTRAMUSCULAR | Status: AC | PRN

## 2016-11-03 MED ORDER — IPRATROPIUM-ALBUTEROL 0.5-2.5 (3) MG/3ML IN SOLN
3.0000 mL | Freq: Once | RESPIRATORY_TRACT | Status: AC
  Administered 2016-11-03: 3 mL via RESPIRATORY_TRACT
  Filled 2016-11-03: qty 3

## 2016-11-03 MED ORDER — MOMETASONE FURO-FORMOTEROL FUM 200-5 MCG/ACT IN AERO
2.0000 | INHALATION_SPRAY | Freq: Two times a day (BID) | RESPIRATORY_TRACT | Status: DC
  Administered 2016-11-05 – 2016-11-08 (×6): 2 via RESPIRATORY_TRACT
  Filled 2016-11-03 (×2): qty 8.8

## 2016-11-03 MED ORDER — LORAZEPAM 2 MG/ML IJ SOLN
0.5000 mg | Freq: Once | INTRAMUSCULAR | Status: AC
  Administered 2016-11-03: 0.5 mg via INTRAVENOUS
  Filled 2016-11-03: qty 1

## 2016-11-03 NOTE — ED Notes (Signed)
Pt provided with lunch tray and niece assisting with eating. Wanted to wait until she had food to take her medications.

## 2016-11-03 NOTE — ED Notes (Signed)
Pt becoming increasingly short of breath.  RT paged for neb.

## 2016-11-03 NOTE — ED Notes (Signed)
Pt much more relaxed after returning from xray.  States her breathing also feels better.

## 2016-11-03 NOTE — ED Triage Notes (Signed)
Patient complains of shortness of breath that started yesterday. Patient states that she fell and hurt her left knee when short of breath yesterday. Sates used nebulizer this morning with no relief.

## 2016-11-03 NOTE — ED Notes (Signed)
Pt returned from X-ray.  

## 2016-11-03 NOTE — H&P (Addendum)
[facility] History and Physical  Cheyenne Case:[id number] DOB: [birth year] DOA: 11/03/2016  Referring physician: ed PCP: Leonides Grills, MD  Specialists: none  Chief Complaint: sob  HPI:  56 ? COPD Pulm Htn H/o steroid induced hypoglycemia ckd III  Dyspnea over 1 week superimposed on a background of about 1 year progressive worsening of function. She lives with her brother for the past 7 years and over the past 1 year has not been able to walk more than 10 steps without being short of breath or giving out She does not use home oxygen Treated recently by Dr. Hilma Favors office with antibiotics and steroids early January No one was ill around her or had the flu or any other viral illnesses Does not think she ever really recovered from the same No diarrhea, no chest pain, no myalgias  Workup in the emergency room showed BUN/creatinine 35/1.3 WBC 8.7 hemoglobin 13.3 Ct and cxr concerning 2.2 x 1.7 cm LUL--superimposed on severe emphysema  Review of Systems: The patient denies anorexia, fever, weight loss, Denies dark or tarry stool, Unilateral weakness, falls have been noticed recently however No dysuria No hematuria No vomiting of blood  Past Medical History:  Diagnosis Date  . Chronic kidney disease 11/2011.   Creatinine 1.4-1.8.  Marland Kitchen COPD (chronic obstructive pulmonary disease) ([place])   . Emphysema   . Hypertension   . Physical deconditioning 12/24/2011  . Vitamin D deficiency 12/24/2011   History reviewed. No pertinent surgical history. Social History:  Social History   Social History Narrative  . No narrative on file    No Known Allergies  No family history on file.  Never married Hypertension and diabetes in the family  Smoker since age of 65 to about age of 78 1 pack per day Never drinker Worked in the tobacco company No drug use nor alcohol use  Prior to Admission medications   Medication Sig Start Date End Date Taking? Authorizing Provider   albuterol (PROVENTIL) (2.5 MG/3ML) 0.083% nebulizer solution Take 2.5 mg by nebulization every 4 (four) hours as needed. For shortness of breath   Yes Historical Provider, MD  budesonide-formoterol (SYMBICORT) 160-4.5 MCG/ACT inhaler Inhale 2 puffs into the lungs 2 (two) times daily.   Yes Historical Provider, MD  furosemide (LASIX) 20 MG tablet Take 20 mg by mouth 2 (two) times daily.   Yes Historical Provider, MD  lisinopril (PRINIVIL,ZESTRIL) 5 MG tablet Take 1 tablet (5 mg total) by mouth daily. 12/25/11  Yes Rexene Alberts, MD  SPIRIVA HANDIHALER 18 MCG inhalation capsule Take 1 capsule by mouth daily at 2 PM. 09/14/16  Yes Historical Provider, MD  Vitamin D, Ergocalciferol, (DRISDOL) 50000 UNITS CAPS Take 1 capsule (50,000 Units total) by mouth every 7 (seven) days. Patient not taking: Reported on 11/03/2016 12/25/11   Rexene Alberts, MD   Physical Exam: Vitals:   11/03/16 1030 11/03/16 1100 11/03/16 1200 11/03/16 1205  BP: 127/77 136/58 153/61   Pulse: 103 104 102   Resp: (!) 30 (!) 30 26   Temp:      TempSrc:      SpO2: 98% 98% 100% 99%    Pleasant EOMI NCAT moderate respiratory distress and respiratory rate around 30 but able to verbalize and phonate. Sentences now it is no pallor S1-S2 slightly tachycardic 120 range chest has crackles bilaterally and posteriorly in the bases no wheeze She has prominent manubrium sternum I S1-S2 tachycardic No JVD kypho-scoliotic Abdomen is soft nontender nondistended no rebound no guarding She has  no lower extremity edema No rash pt able to move all 4 limbs equally without deficit   Labs on Admission:  Basic Metabolic Panel:  Recent Labs Lab 11/03/16 0746  NA 137  K 3.6  CL 99*  CO2 29  GLUCOSE 182*  BUN 35*  CREATININE 1.36*  CALCIUM 9.0   Liver Function Tests: No results for input(s): AST, ALT, ALKPHOS, BILITOT, PROT, ALBUMIN in the last 168 hours. No results for input(s): LIPASE, AMYLASE in the last 168 hours. No results for  input(s): AMMONIA in the last 168 hours. CBC:  Recent Labs Lab 11/03/16 0746  WBC 8.7  NEUTROABS 6.6  HGB 13.3  HCT 40.8  MCV 99.0  PLT 477*   Cardiac Enzymes:  Recent Labs Lab 11/03/16 0746  TROPONINI <0.03    BNP (last 3 results)  Recent Labs  11/03/16 0746  BNP 97.0    ProBNP (last 3 results) No results for input(s): PROBNP in the last 8760 hours.  CBG: No results for input(s): GLUCAP in the last 168 hours.  Radiological Exams on Admission: Dg Chest 2 View  Result Date: 11/03/2016 CLINICAL DATA:  Chronic shortness of breath with increased symptoms since yesterday. History of COPD, pulmonary hypertension, former smoker. EXAM: CHEST  2 VIEW COMPARISON:  Portable chest x-ray of November 23, 2011 FINDINGS: The lungs are hyperinflated. There is an abnormal approximately 2 x 2.5 cm soft tissue density in the inferior -lateral aspect of the left upper lobe. There is a coarse popcorn like calcification in the anterior aspect of the lingula. Elsewhere the lung parenchyma is clear. The heart is normal in size. The pulmonary vascularity is not engorged. There is calcification in the wall of the aortic arch. There is a hiatal hernia. The bony thorax exhibits no acute abnormality. IMPRESSION: COPD. New abnormal masslike density inferior laterally in the left upper lobe worrisome for malignancy. Its appearance is atypical for pneumonia. Chest CT scanning now is recommended. Thoracic aortic atherosclerosis. Electronically Signed   By: David  Martinique M.D.   On: 11/03/2016 08:42   Ct Chest W Contrast  Result Date: 11/03/2016 CLINICAL DATA:  Shortness of breath for 2 days.  COPD. EXAM: CT CHEST WITH CONTRAST TECHNIQUE: Multidetector CT imaging of the chest was performed during intravenous contrast administration. CONTRAST:  55m ISOVUE-300 IOPAMIDOL (ISOVUE-300) INJECTION 61% COMPARISON:  None. FINDINGS: Cardiovascular: Normal heart size. No pericardial effusion. Coronary artery  atherosclerosis in the left main, lad, circumflex and RCA. Normal caliber thoracic aorta with atherosclerosis. Mediastinum/Nodes: No enlarged mediastinal, hilar, or axillary lymph nodes. Thyroid gland, trachea, and esophagus demonstrate no significant findings. Lungs/Pleura: Severe bilateral centrilobular and paraseptal emphysema. 2.2 x 1.7 cm pulmonary nodule in the posterior segment of the left upper lobe most concerning for primary lung malignancy. No other pulmonary nodule or pulmonary mass. Upper Abdomen: Moderate size hiatal hernia. 2.6 cm hypodense left hepatic mass with peripheral nodular enhancement most consistent with a hemangioma. 2.2 cm hypodense right hepatic mass with peripheral nodular enhancement most consistent with a hemangioma. No acute upper abdominal abnormality. Fat containing left para-aortic hernia. Musculoskeletal: No acute osseous abnormality. No lytic or sclerotic osseous lesion. 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 IMPRESSION: 1. 2.2 x 1.7 cm pulmonary nodule in the posterior segment of the left upper lobe most concerning for primary lung malignancy. Oncology consultation is recommended. 2. Severe bilateral emphysema. 3.  Aortic Atherosclerosis (ICD10-170.0) Electronically Signed   By: HKathreen Devoid  On: 11/03/2016 11:44    EKG: Independently  reviewed. PR interval 0.04 QRS axis 45  Low voltage complexes No acute injury pattern   Assessment/Plan  Acute exacerbation of COPD superimposed on severe emphysema Start DuoNeb every 4 hourly scheduled, continue Spiriva 1 capsule daily Hold albuterol neb Substitute dulera for Symbicort Will give a burst of steroids with prednisone 60 for 5 days Admit patient to telemetry  2 cm mass  right upper lobe With 60-pack-year history of smoking-probable diagnosis is cancer We'll consult pulmonology Dr. Luan Pulling for input--probably will benefit from bronchoscopy and biopsy with pathology Will need staging eventually with  MRI brain and CT abdomen pelvis non-emergently [could occur as outpatient pending biopsy] I had a long discussion with the family and recommended watchful waiting now while we await resolution of her concerning pulmonary issues  Hypertension Continue lisinopril 5 daily  ckd 3 Lasix 20 twice a day has been held for now As patient received contrast for CT chest and will be obtaining CT abdomen with contrast in a.m. 2/7 place on saline 50 cc an hour  Prior chronic T6 T9 vertebral compression fractures Poor current evidence for vitamin D or calcium supplementation so hold vitamin D for now  Steroid-induced hyperglycemia Check labs in a.m. and if that sugars above 180 may need sliding scale coverage  Inpatient pending workup Will need physical therapy input has only walks about 10 feet and brother is willing to take care of her at home but feels that she has declined significantly Full CODE STATUS Telemetry  Time spent: [place], [facility] Pager [phone]  If 7PM-7AM, please contact night-coverage [email or web address] Password [facility] 11/03/2016, 12:31 PM

## 2016-11-03 NOTE — ED Provider Notes (Signed)
[email or web address]-EMERGENCY DEPT Provider Note   CSN: [id number] Arrival date & time: 11/03/16  0709     History   Chief Complaint Chief Complaint  Patient presents with  . Shortness of Breath    HPI Cheyenne Case is a 76 y.o. female.  HPI   75yF with dyspnea. Acutely worsening over past week, but has actually been declining functionally for about a year. Occasional cough. No fever or chills. No unusual swelling. No home o2 use.   Past Medical History:  Diagnosis Date  . Chronic kidney disease 11/2011.   Creatinine 1.4-1.8.  Marland Kitchen COPD (chronic obstructive pulmonary disease) ([place])   . Emphysema   . Hypertension   . Physical deconditioning 12/24/2011  . Vitamin D deficiency 12/24/2011    Patient Active Problem List   Diagnosis Date Noted  . Vitamin D deficiency 12/24/2011  . Volume overload 12/24/2011  . Physical deconditioning 12/24/2011  . Pulmonary hypertension due to COPD ([place]) 12/23/2011  . Acute bronchitis 12/19/2011  . COPD with acute exacerbation ([place]) 12/19/2011  . Weakness generalized 12/19/2011  . Diarrhea 12/19/2011  . Anemia 12/19/2011  . Anorexia 12/19/2011  . Dehydration 12/19/2011  . Acute renal insufficiency 12/19/2011  . Benign hypertension 12/19/2011  . Obesity 12/19/2011    History reviewed. No pertinent surgical history.  OB History    No data available       Home Medications    Prior to Admission medications   Medication Sig Start Date End Date Taking? Authorizing Provider  albuterol (PROVENTIL) (2.5 MG/3ML) 0.083% nebulizer solution Take 2.5 mg by nebulization every 4 (four) hours as needed. For shortness of breath    Historical Provider, MD  budesonide-formoterol (SYMBICORT) 160-4.5 MCG/ACT inhaler Inhale 2 puffs into the lungs 2 (two) times daily.    Historical Provider, MD  clonazePAM (KLONOPIN) 0.5 MG tablet Take 0.5 tablets (0.25 mg total) by mouth 2 (two) times daily. 12/25/11 01/24/12  Rexene Alberts, MD  feeding supplement  (ENSURE COMPLETE) LIQD Take 237 mLs by mouth 2 (two) times daily between meals. 12/25/11   Rexene Alberts, MD  furosemide (LASIX) 20 MG tablet Take 20 mg by mouth 2 (two) times daily.    Historical Provider, MD  ipratropium (ATROVENT) 0.02 % nebulizer solution Take 500 mcg by nebulization 4 (four) times daily as needed. For shortness of breath    Historical Provider, MD  levalbuterol (XOPENEX) 0.63 MG/3ML nebulizer solution Take 3 mLs (0.63 mg total) by nebulization every 4 (four) hours. 12/25/11 12/24/12  Rexene Alberts, MD  lisinopril (PRINIVIL,ZESTRIL) 5 MG tablet Take 1 tablet (5 mg total) by mouth daily. 12/25/11   Rexene Alberts, MD  predniSONE (DELTASONE) 20 MG tablet Take 2 tablets twice daily for 1 more day; then 1-1/2 tablets twice daily for 2 more days; then 1 tablet twice daily for 2 more days; then a half a tablet twice daily for 2 more days; then a half a tablet daily for 2 more days then stop. 12/25/11   Rexene Alberts, MD  Vitamin D, Ergocalciferol, (DRISDOL) 50000 UNITS CAPS Take 1 capsule (50,000 Units total) by mouth every 7 (seven) days. 12/25/11   Rexene Alberts, MD    Family History No family history on file.  Social History Social History  Substance Use Topics  . Smoking status: Former Research scientist (life sciences)  . Smokeless tobacco: Never Used  . Alcohol use No     Allergies   Patient has no known allergies.   Review of Systems Review of Systems  All  systems reviewed and negative, other than as noted in HPI.   Physical Exam Updated Vital Signs BP 147/56 (BP Location: Left Arm)   Pulse 114   Temp 97.6 F (36.4 C) (Oral)   Resp 22   SpO2 (!) 88% Comment: RN put on 2L o2 100%  Physical Exam  Constitutional: She appears well-developed and well-nourished. No distress.  HENT:  Head: Normocephalic and atraumatic.  Eyes: Conjunctivae are normal. Right eye exhibits no discharge. Left eye exhibits no discharge.  Neck: Neck supple.  Cardiovascular: Normal rate, regular rhythm and normal  heart sounds.  Exam reveals no gallop and no friction rub.   No murmur heard. Pulmonary/Chest: Effort normal. No respiratory distress. She has wheezes.  Abdominal: Soft. She exhibits no distension. There is no tenderness.  Musculoskeletal: She exhibits no edema or tenderness.  Neurological: She is alert.  Skin: Skin is warm and dry.  Psychiatric: She has a normal mood and affect. Her behavior is normal. Thought content normal.  Nursing note and vitals reviewed.    ED Treatments / Results  Labs (all labs ordered are listed, but only abnormal results are displayed) Labs Reviewed - No data to display  EKG  EKG Interpretation  Date/Time:  Tuesday November 03 2016 07:19:22 EST Ventricular Rate:  108 PR Interval:    QRS Duration: 79 QT Interval:  340 QTC Calculation: 456 R Axis:   96 Text Interpretation:  Sinus tachycardia Consider right atrial enlargement Right axis deviation Confirmed by Wilson Singer  MD, Nesreen Albano ([id number]) on 11/03/2016 7:57:35 AM            Radiology No results found.  Procedures Procedures (including critical care time)  Medications Ordered in ED Medications - No data to display   Initial Impression / Assessment and Plan / ED Course  I have reviewed the triage vital signs and the nursing notes.  Pertinent labs & imaging results that were available during my care of the patient were reviewed by me and considered in my medical decision making (see chart for details).     Final Clinical Impressions(s) / ED Diagnoses   Final diagnoses:  COPD exacerbation ([place])  Lung mass  Chronic obstructive pulmonary disease with acute lower respiratory infection ([place])    New Prescriptions New Prescriptions   No medications on file     Virgel Manifold, MD 11/16/16 1635

## 2016-11-03 NOTE — ED Notes (Signed)
Pt sleeping at this time.  Niece denies any needs.

## 2016-11-04 ENCOUNTER — Inpatient Hospital Stay (HOSPITAL_COMMUNITY): Payer: Medicare Other

## 2016-11-04 DIAGNOSIS — J440 Chronic obstructive pulmonary disease with acute lower respiratory infection: Secondary | ICD-10-CM

## 2016-11-04 DIAGNOSIS — R918 Other nonspecific abnormal finding of lung field: Secondary | ICD-10-CM

## 2016-11-04 DIAGNOSIS — Z87891 Personal history of nicotine dependence: Secondary | ICD-10-CM

## 2016-11-04 LAB — CBC
HCT: 35.8 % — ABNORMAL LOW (ref 36.0–46.0)
Hemoglobin: 11.8 g/dL — ABNORMAL LOW (ref 12.0–15.0)
MCH: 32.4 pg (ref 26.0–34.0)
MCHC: 33 g/dL (ref 30.0–36.0)
MCV: 98.4 fL (ref 78.0–100.0)
PLATELETS: 390 10*3/uL (ref 150–400)
RBC: 3.64 MIL/uL — AB (ref 3.87–5.11)
RDW: 13.6 % (ref 11.5–15.5)
WBC: 10.3 10*3/uL (ref 4.0–10.5)

## 2016-11-04 LAB — COMPREHENSIVE METABOLIC PANEL
ALK PHOS: 68 U/L (ref 38–126)
ALT: 15 U/L (ref 14–54)
ANION GAP: 10 (ref 5–15)
AST: 22 U/L (ref 15–41)
Albumin: 3.2 g/dL — ABNORMAL LOW (ref 3.5–5.0)
BILIRUBIN TOTAL: 1.2 mg/dL (ref 0.3–1.2)
BUN: 34 mg/dL — ABNORMAL HIGH (ref 6–20)
CALCIUM: 8.5 mg/dL — AB (ref 8.9–10.3)
CO2: 27 mmol/L (ref 22–32)
CREATININE: 1.38 mg/dL — AB (ref 0.44–1.00)
Chloride: 100 mmol/L — ABNORMAL LOW (ref 101–111)
GFR, EST AFRICAN AMERICAN: 42 mL/min — AB (ref >60)
GFR, EST NON AFRICAN AMERICAN: 36 mL/min — AB (ref >60)
Glucose, Bld: 142 mg/dL — ABNORMAL HIGH (ref 65–99)
Potassium: 3.9 mmol/L (ref 3.5–5.1)
Sodium: 137 mmol/L (ref 135–145)
TOTAL PROTEIN: 6 g/dL — AB (ref 6.5–8.1)

## 2016-11-04 MED ORDER — ENOXAPARIN SODIUM 30 MG/0.3ML ~~LOC~~ SOLN
20.0000 mg | SUBCUTANEOUS | Status: DC
  Filled 2016-11-04: qty 0.3

## 2016-11-04 MED ORDER — ENOXAPARIN SODIUM 40 MG/0.4ML ~~LOC~~ SOLN
20.0000 mg | SUBCUTANEOUS | Status: DC
  Administered 2016-11-04 – 2016-11-05 (×2): 20 mg via SUBCUTANEOUS
  Filled 2016-11-04 (×2): qty 0.4

## 2016-11-04 MED ORDER — ALBUTEROL SULFATE (2.5 MG/3ML) 0.083% IN NEBU
2.5000 mg | INHALATION_SOLUTION | RESPIRATORY_TRACT | Status: DC | PRN
  Administered 2016-11-04 – 2016-11-08 (×10): 2.5 mg via RESPIRATORY_TRACT
  Filled 2016-11-04 (×9): qty 3

## 2016-11-04 MED ORDER — IOPAMIDOL (ISOVUE-300) INJECTION 61%
INTRAVENOUS | Status: AC
  Administered 2016-11-04: 11:00:00
  Filled 2016-11-04: qty 30

## 2016-11-04 MED ORDER — IOPAMIDOL (ISOVUE-300) INJECTION 61%
75.0000 mL | Freq: Once | INTRAVENOUS | Status: AC | PRN
  Administered 2016-11-04: 75 mL via INTRAVENOUS

## 2016-11-04 MED ORDER — ENOXAPARIN SODIUM 30 MG/0.3ML ~~LOC~~ SOLN: 20.0000 mg | SUBCUTANEOUS | Status: DC

## 2016-11-04 NOTE — Progress Notes (Signed)
Pharmacy:  Lovenox Dosing  Filed Weights   11/03/16 1647  Weight: 53 lb (24 kg)   Estimated Creatinine Clearance: 13.1 mL/min (by C-G formula based on SCr of 1.38 mg/dL (H)).  Lovenox dose adjusted to 26m SQ q24hrs due to weight and renal fxn.  TPeggye Ley PharmD Clinical Pharmacist Pager:  [phone]/03/2017

## 2016-11-04 NOTE — Progress Notes (Signed)
PROGRESS NOTE    Cheyenne Case  PJA:[id number] DOB: Sep 01, 1941 DOA: 11/03/2016 PCP: Leonides Grills, MD    Brief Narrative:  4 ? COPD, Pulm Htn, H/o steroid induced hypoglycemia, ckd III.  Dyspnea over 1 week superimposed on a background of about 1 year progressive worsening of function. She lives with her brother for the past 7 years and over the past 1 year has not been able to walk more than 10 steps without being short of breath or giving out. She does not use home oxygen. Treated recently by Dr. Hilma Favors office with antibiotics and steroids early January. No one was ill around her or had the flu or any other viral illnesses. Does not think she ever really recovered from the same. No diarrhea, no chest pain, no myalgias  Workup in the emergency room showed BUN/creatinine 35/1.3, WBC 8.7 hemoglobin 13.3, CT and cxr concerning 2.2 x 1.7 cm LUL--superimposed on severe emphysema.   Assessment & Plan:   Active Problems:   COPD (chronic obstructive pulmonary disease) (HCC)   COPD exacerbation (HCC)   Acute exacerbation of COPD superimposed on severe emphysema - Start DuoNeb every 4 hourly scheduled, continue Spiriva 1 capsule daily - Hold albuterol neb - Substitute dulera for Symbicort - Continue burst of steroids with prednisone 60 for 5 days  2 cm mass  right upper lobe - With 60-pack-year history of smoking-probable diagnosis is cancer - Dr. Luan Pulling consulted- may benefit from bronchoscopy and biopsy with pathology - CT of abdomen with contrast showing no metastasis  Hypertension - Continue lisinopril 5 daily  CKD3  - Lasix 20 twice a day has been held for now - creatinine stable - can restart lasix in am if creatinine stable  Prior chronic T6 T9 vertebral compression fractures - Poor current evidence for vitamin D or calcium supplementation so hold vitamin D for now  Steroid-induced hyperglycemia - continue to monitor CBG's  Inpatient pending workup - PT  recommending HH PT - will consult CM for home oxygen PT needs   DVT prophylaxis:  Code Status: Full code Family Communication:  Disposition Plan: pending PT eval    Consultants:   Pulmonology  PT  Procedures:   None  Antimicrobials:   None    Subjective: Patient reports she feels better wearing oxygen.  Asking about what her CT scan from today showed.  Discussed results with patient and her brother.  Asking if bronchoscopy or lung biopsy could be done inpatient.  Objective: Vitals:   11/04/16 0516 11/04/16 0636 11/04/16 0833 11/04/16 1039  BP:  (!) 113/44  (!) 95/52  Pulse:  90    Resp:  18    Temp:  97.4 F (36.3 C)    TempSrc:  Oral    SpO2: 98% 99% 98%   Weight:      Height:        Intake/Output Summary (Last 24 hours) at 11/04/16 1334 Last data filed at 11/04/16 0524  Gross per 24 hour  Intake            642.5 ml  Output              600 ml  Net             42.5 ml   Filed Weights   11/03/16 1647  Weight: 24 kg (53 lb)    Examination:  General exam: Slightly increased work of breathing with talking in sentences Respiratory system: poor air movement, slightly increased work of breathing, pursed  lip breathing, no wheezing but scattered rales Cardiovascular system: S1 & S2 heard, RRR. No JVD, murmurs, rubs, gallops or clicks. No pedal edema. Gastrointestinal system: Abdomen is nondistended, soft and nontender. No organomegaly or masses felt. Normal bowel sounds heard. Central nervous system: Alert and oriented. No focal neurological deficits. Extremities: Symmetric 5 x 5 power. Right upper extremity swollen at site of IV infiltration Skin: No rashes, lesions or ulcers but scattered ecchymoses Psychiatry: Judgement and insight appear normal. Mood & affect appropriate.     Data Reviewed: I have personally reviewed following labs and imaging studies  CBC:  Recent Labs Lab 11/03/16 0746 11/04/16 0440  WBC 8.7 10.3  NEUTROABS 6.6  --   HGB 13.3  11.8*  HCT 40.8 35.8*  MCV 99.0 98.4  PLT 477* 850   Basic Metabolic Panel:  Recent Labs Lab 11/03/16 0746 11/04/16 0440  NA 137 137  K 3.6 3.9  CL 99* 100*  CO2 29 27  GLUCOSE 182* 142*  BUN 35* 34*  CREATININE 1.36* 1.38*  CALCIUM 9.0 8.5*   GFR: Estimated Creatinine Clearance: 13.1 mL/min (by C-G formula based on SCr of 1.38 mg/dL (H)). Liver Function Tests:  Recent Labs Lab 11/04/16 0440  AST 22  ALT 15  ALKPHOS 68  BILITOT 1.2  PROT 6.0*  ALBUMIN 3.2*   No results for input(s): LIPASE, AMYLASE in the last 168 hours. No results for input(s): AMMONIA in the last 168 hours. Coagulation Profile: No results for input(s): INR, PROTIME in the last 168 hours. Cardiac Enzymes:  Recent Labs Lab 11/03/16 0746  TROPONINI <0.03   BNP (last 3 results) No results for input(s): PROBNP in the last 8760 hours. HbA1C: No results for input(s): HGBA1C in the last 72 hours. CBG: No results for input(s): GLUCAP in the last 168 hours. Lipid Profile: No results for input(s): CHOL, HDL, LDLCALC, TRIG, CHOLHDL, LDLDIRECT in the last 72 hours. Thyroid Function Tests: No results for input(s): TSH, T4TOTAL, FREET4, T3FREE, THYROIDAB in the last 72 hours. Anemia Panel: No results for input(s): VITAMINB12, FOLATE, FERRITIN, TIBC, IRON, RETICCTPCT in the last 72 hours. Sepsis Labs: No results for input(s): PROCALCITON, LATICACIDVEN in the last 168 hours.  No results found for this or any previous visit (from the past 240 hour(s)).       Radiology Studies: Dg Chest 2 View  Result Date: 11/03/2016 CLINICAL DATA:  Chronic shortness of breath with increased symptoms since yesterday. History of COPD, pulmonary hypertension, former smoker. EXAM: CHEST  2 VIEW COMPARISON:  Portable chest x-ray of November 23, 2011 FINDINGS: The lungs are hyperinflated. There is an abnormal approximately 2 x 2.5 cm soft tissue density in the inferior -lateral aspect of the left upper lobe. There is a  coarse popcorn like calcification in the anterior aspect of the lingula. Elsewhere the lung parenchyma is clear. The heart is normal in size. The pulmonary vascularity is not engorged. There is calcification in the wall of the aortic arch. There is a hiatal hernia. The bony thorax exhibits no acute abnormality. IMPRESSION: COPD. New abnormal masslike density inferior laterally in the left upper lobe worrisome for malignancy. Its appearance is atypical for pneumonia. Chest CT scanning now is recommended. Thoracic aortic atherosclerosis. Electronically Signed   By: David  Martinique M.D.   On: 11/03/2016 08:42   Ct Chest W Contrast  Result Date: 11/03/2016 CLINICAL DATA:  Shortness of breath for 2 days.  COPD. EXAM: CT CHEST WITH CONTRAST TECHNIQUE: Multidetector CT imaging of the chest  was performed during intravenous contrast administration. CONTRAST:  67m ISOVUE-300 IOPAMIDOL (ISOVUE-300) INJECTION 61% COMPARISON:  None. FINDINGS: Cardiovascular: Normal heart size. No pericardial effusion. Coronary artery atherosclerosis in the left main, lad, circumflex and RCA. Normal caliber thoracic aorta with atherosclerosis. Mediastinum/Nodes: No enlarged mediastinal, hilar, or axillary lymph nodes. Thyroid gland, trachea, and esophagus demonstrate no significant findings. Lungs/Pleura: Severe bilateral centrilobular and paraseptal emphysema. 2.2 x 1.7 cm pulmonary nodule in the posterior segment of the left upper lobe most concerning for primary lung malignancy. No other pulmonary nodule or pulmonary mass. Upper Abdomen: Moderate size hiatal hernia. 2.6 cm hypodense left hepatic mass with peripheral nodular enhancement most consistent with a hemangioma. 2.2 cm hypodense right hepatic mass with peripheral nodular enhancement most consistent with a hemangioma. No acute upper abdominal abnormality. Fat containing left para-aortic hernia. Musculoskeletal: No acute osseous abnormality. No lytic or sclerotic osseous lesion.  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 IMPRESSION: 1. 2.2 x 1.7 cm pulmonary nodule in the posterior segment of the left upper lobe most concerning for primary lung malignancy. Oncology consultation is recommended. 2. Severe bilateral emphysema. 3.  Aortic Atherosclerosis (ICD10-170.0) Electronically Signed   By: HKathreen Devoid  On: 11/03/2016 11:44        Scheduled Meds: . azithromycin  500 mg Oral Daily  . enoxaparin (LOVENOX) injection  20 mg Subcutaneous Q24H  . guaiFENesin  600 mg Oral BID  . iopamidol      . ipratropium  0.5 mg Nebulization TID  . lisinopril  5 mg Oral Daily  . mouth rinse  15 mL Mouth Rinse BID  . mometasone-formoterol  2 puff Inhalation BID  . predniSONE  60 mg Oral QAC breakfast  . sodium chloride flush  3 mL Intravenous Q12H  . tiotropium  18 mcg Inhalation Q1400   Continuous Infusions: . sodium chloride 50 mL/hr at 11/03/16 1857     LOS: 1 day    Time spent: 30 minutes    ALoretha Stapler MD [facility] Pager [phone] If 7PM-7AM, please contact night-coverage [email or web address] Password [email or web address] 11/04/2016, 1:34 PM

## 2016-11-04 NOTE — Evaluation (Signed)
Physical Therapy Evaluation Patient Details Name: Cheyenne Case MRN: [id number] DOB: Jan 19, 1941 Today's Date: 11/04/2016   History of Present Illness  76 yo F admitted due to dyspnea over 1 week superimposed on a background of about 1 year progressive worsening of function. She lives with her brother for the past 7 years and over the past 1 year has not been able to walk more than 10 steps without being short of breath or giving out  Dx: Acute exacerbation of COPD superimposed on severe emphysema  Clinical Impression  Pt received sitting up on the EOB, brother present, and pt is agreeable to PT evaluation.  Pt states that she normally does not use any type of DME for ambulation at home, but she is only able to ambulate a few steps without stopping to rest.  She is independent with dressing and sponge bathing.  During PT evaluation, she demonstrated sit<>Stand at Mod (I) level with RW, and then ambulated 17f with RW, min guard, and 1 standing rest break.  She is recommended to follow up with HHPT, and is recommended for a rollator walker.  When discussing PT recommendations, the brother became angry with recommendations, stating "[place]  When she gets out of the hospital she is not going to be on these medications anymore, and we are going to end up right back in the ED for 9 hours."  Pt educated both the brother and the patient on recommendations again, however brother is still angry about the recommendation.  When PT left the room, overheard brother asking the patient why she did so well with PT today.     Follow Up Recommendations Home health PT    Equipment Recommendations  Other (comment) (Rollator [phone]    Recommendations for Other Services       Precautions / Restrictions Precautions Precautions: Fall Precaution Comments: 1 - squatted down to pick up something, and got a cramp in the L LE, and she sat down on the floor.   Restrictions Weight Bearing Restrictions: No       Mobility  Bed Mobility               General bed mobility comments: Pt had just finished using the BSC, and was sitting on the EOB.  Transfers Overall transfer level: Modified independent Equipment used: Rolling walker (2 wheeled) Transfers: Sit to/from [place]are[place] to Stand: Modified independent (Device/Increase time) Stand pivot transfers: Min guard          Ambulation/Gait Ambulation/Gait assistance: Min guard Ambulation Distance (Feet): 60 Feet Assistive device: Rolling walker (2 wheeled) Gait Pattern/deviations: Step-through pattern;Trunk flexed     General Gait Details: Pt required 1 standing rest break.  Pt reported that her rate of perceived exertion on the modified BORG is 5/10.  Stairs            Wheelchair Mobility    Modified Rankin (Stroke Patients Only)       Balance Overall balance assessment: History of Falls;Needs assistance Sitting-balance support: Bilateral upper extremity supported;Feet supported Sitting balance-Leahy Scale: Good     Standing balance support: Bilateral upper extremity supported Standing balance-Leahy Scale: Good                               Pertinent Vitals/Pain Pain Assessment: No/denies pain    Home Living   Living Arrangements: Other relatives (brother) Available Help at Discharge: Available 24 hours/day Type  of Home: House Home Access: Stairs to enter   [employer] of Steps: garage - 5 with HR.  Home Layout: One level Home Equipment: None      Prior Function     Gait / Transfers Assistance Needed: Brother states she is only able to ambulate 5 steps without propping up against something to catch her breath.    ADL's / Homemaking Assistance Needed: independent with dressing, and bathing - sponge bath.  Brother runs errands.          Hand Dominance   Dominant Hand: Right    Extremity/Trunk Assessment   Upper Extremity Assessment Upper  Extremity Assessment: Generalized weakness    Lower Extremity Assessment Lower Extremity Assessment: Generalized weakness       Communication   Communication: No difficulties  Cognition Arousal/Alertness: Awake/alert Behavior During Therapy: WFL for tasks assessed/performed Overall Cognitive Status: Within Functional Limits for tasks assessed                      General Comments      Exercises     Assessment/Plan    PT Assessment All further PT needs can be met in the next venue of care  PT Problem List Decreased strength;Decreased activity tolerance;Decreased balance;Decreased mobility;Decreased knowledge of use of DME          PT Treatment Interventions      PT Goals (Current goals can be found in the Care Plan section)  Acute Rehab PT Goals Patient Stated Goal: To improve functional mobility PT Goal Formulation: All assessment and education complete, DC therapy    Frequency     Barriers to discharge        Co-evaluation               End of Session Equipment Utilized During Treatment: Gait belt;Oxygen Activity Tolerance: Patient limited by fatigue Patient left: in chair;with call bell/phone within reach;with family/visitor present Nurse Communication: Mobility status Jenny Reichmann, RN notified of pt's mobility status, and mobility sheet left hanging in the room.  )    Functional Assessment Tool Used: [employer] "6-clicks"  Functional Limitation: Mobility: Walking and moving around Mobility: Walking and Moving Around Current Status [phone]): At least 20 percent but less than 40 percent impaired, limited or restricted Mobility: Walking and Moving Around Goal Status [phone]): At least 20 percent but less than 40 percent impaired, limited or restricted Mobility: Walking and Moving Around Discharge Status [phone]): At least 20 percent but less than 40 percent impaired, limited or restricted    Time: 1321-1355 PT Time Calculation (min) (ACUTE  ONLY): 34 min   Charges:   PT Evaluation $PT Eval Low Complexity: 1 Procedure PT Treatments $Gait Training: 8-22 mins   PT G Codes:   PT G-Codes **NOT FOR INPATIENT CLASS** Functional Assessment Tool Used: [employer] "6-clicks"  Functional Limitation: Mobility: Walking and moving around Mobility: Walking and Moving Around Current Status [phone]): At least 20 percent but less than 40 percent impaired, limited or restricted Mobility: Walking and Moving Around Goal Status [phone]): At least 20 percent but less than 40 percent impaired, limited or restricted Mobility: Walking and Moving Around Discharge Status [phone]): At least 20 percent but less than 40 percent impaired, limited or restricted    Beth Darielys Giglia, PT, DPT X: [phone]

## 2016-11-05 DIAGNOSIS — R911 Solitary pulmonary nodule: Secondary | ICD-10-CM

## 2016-11-05 MED ORDER — ALBUTEROL SULFATE (2.5 MG/3ML) 0.083% IN NEBU
2.5000 mg | INHALATION_SOLUTION | Freq: Three times a day (TID) | RESPIRATORY_TRACT | Status: DC
  Administered 2016-11-06 – 2016-11-07 (×3): 2.5 mg via RESPIRATORY_TRACT
  Filled 2016-11-05 (×4): qty 3

## 2016-11-05 NOTE — Care Management Note (Signed)
Case Management Note  Patient Details  Name: Cheyenne Case MRN: [id number] Date of Birth: 03-25-1941  Subjective/Objective:                  Pt admitted with copd. She is from home, lives with her brother who struggles to care for her. He reports she becomes weak after walking short distances and is SOB with activity. Pt has a walker at home but does not have rollator. She does not have supplemental oxygen PTA but will be have qualification screening prior to DC. Pt will need HH services, including PT, as recommended by therapy. Pt has used [facility] in the past. She request using them again and would like her DME from them also. Brother at bedside to discuss plan.   Action/Plan: CM will cont to follow. [facility] aware of referrals and will obtain pt info from chart.    Expected Discharge Date:    11/07/2016              Expected Discharge Plan:  [place]  In-House Referral:  NA  Discharge planning Services  CM Consult  Post Acute Care Choice:  Durable Medical Equipment Choice offered to:  Patient  DME Arranged:  Walker rolling with seat DME Agency:  [place] Arranged:  RN, PT, Nurse's Aide [place] Agency:  [place]  Status of Service:  In process, will continue to follow  Sherald Barge, RN 11/05/2016, 3:01 PM

## 2016-11-05 NOTE — Progress Notes (Signed)
SATURATION QUALIFICATIONS: (This note is used to comply with regulatory documentation for home oxygen)  Patient Saturations on Room Air at Rest = 97%  Patient Saturations on Room Air while Ambulating = 83%  Patient Saturations on 2 Liters of oxygen while Ambulating = 93%  Please briefly explain why patient needs home oxygen: O2 drops with activity and no supplimental O2

## 2016-11-05 NOTE — Progress Notes (Signed)
PROGRESS NOTE    Cheyenne HOADLEY  Case:[id number] DOB: Sep 07, 1941 DOA: 11/03/2016 PCP: Leonides Grills, MD    Brief Narrative:  1 ? COPD, Pulm Htn, H/o steroid induced hypoglycemia, ckd III.  Dyspnea over 1 week superimposed on a background of about 1 year progressive worsening of function. She lives with her brother for the past 7 years and over the past 1 year has not been able to walk more than 10 steps without being short of breath or giving out. She does not use home oxygen. Treated recently by Dr. Hilma Favors office with antibiotics and steroids early January. No one was ill around her or had the flu or any other viral illnesses. Does not think she ever really recovered from the same. No diarrhea, no chest pain, no myalgias  Workup in the emergency room showed BUN/creatinine 35/1.3, WBC 8.7 hemoglobin 13.3, CT and cxr concerning 2.2 x 1.7 cm LUL--superimposed on severe emphysema.  Pulmonology consulted for evaluation for possible bronchoscopy.   Assessment & Plan:   Active Problems:   COPD (chronic obstructive pulmonary disease) (HCC)   COPD exacerbation (HCC)   Acute exacerbation of COPD superimposed on severe emphysema - Start DuoNeb every 4 hourly scheduled, continue Spiriva 1 capsule daily - Hold albuterol neb - Substitute dulera for Symbicort - Continue burst of steroids with prednisone 60 for 5 days  2 cm mass  right upper lobe - With 60-pack-year history of smoking-probable diagnosis is cancer - Dr. Luan Pulling consulted- may benefit from bronchoscopy and biopsy with pathology - CT of abdomen with contrast showing no metastasis - patient will unlikely get bronchoscopy due to location of lesion - Dr Luan Pulling discussed with IR and patient may benefit from PET scan outpatient and follow up at thoracic oncology; if no other lesions identified may need radiation oncology to evaluate for tx without biopsy  Hypertension - Continue lisinopril 5 daily  CKD3  - Lasix 20 twice a  day has been held for now - can restart lasix in am if creatinine stable - repeat BMP in am  Prior chronic T6 T9 vertebral compression fractures - Poor current evidence for vitamin D or calcium supplementation so hold vitamin D for now  Steroid-induced hyperglycemia - continue to monitor CBG's  Inpatient pending workup - PT recommending HH PT - will consult CM for home oxygen PT needs   DVT prophylaxis: lovenox Code Status: Full code Family Communication: niece is bedside Disposition Plan: may be stable for discharge in the next 24-48 hours   Consultants:   Pulmonology  PT  Procedures:   None  Antimicrobials:   None    Subjective: Patient asking if she can get mobile oxygen.  Frustrated by having to stay in the hospital.  Does not want home health at time of discharge.  Objective: Vitals:   11/05/16 1152 11/05/16 1441 11/05/16 1445 11/05/16 1521  BP:    121/72  Pulse:    61  Resp:    20  Temp:    98.1 F (36.7 C)  TempSrc:    Oral  SpO2: 97% 99% 99% 98%  Weight:      Height:        Intake/Output Summary (Last 24 hours) at 11/05/16 1559 Last data filed at 11/05/16 1200  Gross per 24 hour  Intake              600 ml  Output             2300 ml  Net            -  1700 ml   Filed Weights   11/03/16 1647  Weight: 24 kg (53 lb)    Examination:  General exam: Slightly increased work of breathing with talking in sentences Respiratory system: poor air movement, slightly increased work of breathing, no wheezing Cardiovascular system: S1 & S2 heard, RRR. No JVD, murmurs, rubs, gallops or clicks. No pedal edema. Gastrointestinal system: Abdomen is nondistended, soft and nontender. No organomegaly or masses felt. Normal bowel sounds heard. Central nervous system: Alert and oriented. No focal neurological deficits. Extremities: Symmetric 5 x 5 power. Right upper extremity swollen at site of IV infiltration Skin: No rashes, lesions or ulcers but scattered  ecchymoses Psychiatry: Judgement and insight appear normal. Mood & affect appropriate.     Data Reviewed: I have personally reviewed following labs and imaging studies  CBC:  Recent Labs Lab 11/03/16 0746 11/04/16 0440  WBC 8.7 10.3  NEUTROABS 6.6  --   HGB 13.3 11.8*  HCT 40.8 35.8*  MCV 99.0 98.4  PLT 477* 665   Basic Metabolic Panel:  Recent Labs Lab 11/03/16 0746 11/04/16 0440  NA 137 137  K 3.6 3.9  CL 99* 100*  CO2 29 27  GLUCOSE 182* 142*  BUN 35* 34*  CREATININE 1.36* 1.38*  CALCIUM 9.0 8.5*   GFR: Estimated Creatinine Clearance: 13.1 mL/min (by C-G formula based on SCr of 1.38 mg/dL (H)). Liver Function Tests:  Recent Labs Lab 11/04/16 0440  AST 22  ALT 15  ALKPHOS 68  BILITOT 1.2  PROT 6.0*  ALBUMIN 3.2*   No results for input(s): LIPASE, AMYLASE in the last 168 hours. No results for input(s): AMMONIA in the last 168 hours. Coagulation Profile: No results for input(s): INR, PROTIME in the last 168 hours. Cardiac Enzymes:  Recent Labs Lab 11/03/16 0746  TROPONINI <0.03   BNP (last 3 results) No results for input(s): PROBNP in the last 8760 hours. HbA1C: No results for input(s): HGBA1C in the last 72 hours. CBG: No results for input(s): GLUCAP in the last 168 hours. Lipid Profile: No results for input(s): CHOL, HDL, LDLCALC, TRIG, CHOLHDL, LDLDIRECT in the last 72 hours. Thyroid Function Tests: No results for input(s): TSH, T4TOTAL, FREET4, T3FREE, THYROIDAB in the last 72 hours. Anemia Panel: No results for input(s): VITAMINB12, FOLATE, FERRITIN, TIBC, IRON, RETICCTPCT in the last 72 hours. Sepsis Labs: No results for input(s): PROCALCITON, LATICACIDVEN in the last 168 hours.  No results found for this or any previous visit (from the past 240 hour(s)).       Radiology Studies: Ct Abdomen Pelvis W Contrast  Result Date: 11/04/2016 CLINICAL DATA:  Lung cancer.  Shortness of breath.  Hiatal hernia. EXAM: CT ABDOMEN AND PELVIS  WITH CONTRAST TECHNIQUE: Multidetector CT imaging of the abdomen and pelvis was performed using the standard protocol following bolus administration of intravenous contrast. CONTRAST:  70m ISOVUE-300 IOPAMIDOL (ISOVUE-300) INJECTION 61% COMPARISON:  CT abdomen pelvis 05/24/2009 FINDINGS: Lower chest: There is emphysema and bibasilar atelectasis. No visible pericardial effusion. There is a moderate-sized hiatal hernia. 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 The largest is at the anterior left hepatic lobe measuring up to 4 cm. No new hepatic lesions. Normal gallbladder. Pancreas: Normal pancreatic contours and enhancement. No peripancreatic fluid collection or pancreatic ductal dilatation. Spleen: Normal. Adrenals/Urinary Tract: Normal adrenal glands. Unchanged large lower pole left renal cyst measuring 5.4 cm. No hydronephrosis or solid renal mass. Stomach/Bowel: No abnormal bowel dilatation. No bowel wall thickening or adjacent fat stranding to  indicate acute inflammation. No abdominal fluid collection. Normal appendix. Vascular/Lymphatic: There is atherosclerotic calcification of the non aneurysmal abdominal aorta. Small partially calcified left para-aortic mass is unchanged in size measuring 1.5 by 0.6 cm Reproductive: Normal uterus. No adnexal mass. No free fluid in the pelvis. Musculoskeletal: No lytic or blastic osseous lesion. Normal visualized extrathoracic and extraperitoneal soft tissues. Other: No contributory non-categorized findings. IMPRESSION: 1. No acute abnormality of the abdomen or pelvis. 2. Unchanged appearance of multiple hepatic hemangiomas. 3. No evidence of metastatic disease to the abdomen or pelvis. Electronically Signed   By: Ulyses Jarred M.D.   On: 11/04/2016 15:11        Scheduled Meds: . azithromycin  500 mg Oral Daily  . enoxaparin (LOVENOX) injection  20 mg Subcutaneous Q24H  . ipratropium  0.5 mg Nebulization TID  . lisinopril  5  mg Oral Daily  . mouth rinse  15 mL Mouth Rinse BID  . mometasone-formoterol  2 puff Inhalation BID  . predniSONE  60 mg Oral QAC breakfast  . sodium chloride flush  3 mL Intravenous Q12H  . tiotropium  18 mcg Inhalation Q1400   Continuous Infusions: . sodium chloride 50 mL/hr at 11/05/16 1359     LOS: 2 days    Time spent: 30 minutes    Loretha Stapler, MD [facility] Pager [phone]  If 7PM-7AM, please contact night-coverage [email or web address] Password [email or web address] 11/05/2016, 3:59 PM

## 2016-11-05 NOTE — Consult Note (Signed)
Interventional Radiology was consulted about a lung biopsy.  76 yo with a left upper lobe lesion and severe emphysema.  I have reviewed the images and feel the patient is very high risk for pneumothorax and complications associated with a percutaneous transthoracic biopsy.  Discussed with Dr. Luan Pulling and recommend an outpatient workup including a PET CT.  If the patient does not have any other lesions except for the lung, would have Rad Oncology evaluate the patient for treatment without biopsy.

## 2016-11-05 NOTE — Consult Note (Signed)
Consult requested by: [facility] Consult requested for: Lung mass  HPI: This is a 76 year old who came to the hospital with increasing shortness of breath for about a week and with worsening shortness of breath that was less acute for about a year. She has a history of COPD in the past. She says that she got to the point that she can only walk a few feet. She has not been on oxygen at home. She feels much better since she has started oxygen. She has been taking antibiotics and steroids but it didn't really seem to make much difference. She has not lost weight. She has coughed mostly nonproductively. She has an extensive smoking history but stopped about 10 years ago. No hemoptysis night sweats fever chills abdominal pain chest pain nausea vomiting headache or joint pain. She does have weakness Past Medical History:  Diagnosis Date  . Chronic kidney disease 11/2011.   Creatinine 1.4-1.8.  Marland Kitchen COPD (chronic obstructive pulmonary disease) ([place])   . Emphysema   . Hypertension   . Physical deconditioning 12/24/2011  . Vitamin D deficiency 12/24/2011     History reviewed. No pertinent family history.   Social History   Social History  . Marital status: Single    Spouse name: N/A  . Number of children: N/A  . Years of education: N/A   Social History Main Topics  . Smoking status: Former Research scientist (life sciences)  . Smokeless tobacco: Never Used  . Alcohol use No  . Drug use: No  . Sexual activity: Not Asked   Other Topics Concern  . None   Social History Narrative  . None     ROS: Except as mentioned 10 point review of systems is negative    Objective: Vital signs in last 24 hours: Temp:  [97.5 F (36.4 C)-98.2 F (36.8 C)] 97.7 F (36.5 C) (02/08 0531) Pulse Rate:  [88-98] 88 (02/08 0531) Resp:  [20] 20 (02/08 0531) BP: (95-150)/(51-69) 112/59 (02/08 0531) SpO2:  [96 %-100 %] 100 % (02/08 0531) Weight change:  Last BM Date: 11/04/16  Intake/Output from previous day: 02/07 0701 -  02/08 0700 In: 360 [P.O.:360] Out: 2500 [Urine:2500]  PHYSICAL EXAM Constitutional: She is awake and alert and in no acute distress. Eyes: Pupils react EOMI. Ears nose mouth and throat: Mucous membranes are moist. Hearing is grossly normal. Cardiovascular: Her heart is regular with normal heart sounds. No edema. Respiratory: Her lungs show diminished breath sounds bilaterally. Normal respiratory effort now. She is wearing oxygen. Gastrointestinal: Her abdomen is soft with no masses. Musculoskeletal: She is mildly weak in the extremities with full range of motion. Neurological: No focal abnormalities. Psychiatric: Normal mood and affect  Lab Results: Basic Metabolic Panel:  Recent Labs  11/03/16 0746 11/04/16 0440  NA 137 137  K 3.6 3.9  CL 99* 100*  CO2 29 27  GLUCOSE 182* 142*  BUN 35* 34*  CREATININE 1.36* 1.38*  CALCIUM 9.0 8.5*   Liver Function Tests:  Recent Labs  11/04/16 0440  AST 22  ALT 15  ALKPHOS 68  BILITOT 1.2  PROT 6.0*  ALBUMIN 3.2*   No results for input(s): LIPASE, AMYLASE in the last 72 hours. No results for input(s): AMMONIA in the last 72 hours. CBC:  Recent Labs  11/03/16 0746 11/04/16 0440  WBC 8.7 10.3  NEUTROABS 6.6  --   HGB 13.3 11.8*  HCT 40.8 35.8*  MCV 99.0 98.4  PLT 477* 390   Cardiac Enzymes:  Recent Labs  11/03/16  0746  TROPONINI <0.03   BNP: No results for input(s): PROBNP in the last 72 hours. D-Dimer: No results for input(s): DDIMER in the last 72 hours. CBG: No results for input(s): GLUCAP in the last 72 hours. Hemoglobin A1C: No results for input(s): HGBA1C in the last 72 hours. Fasting Lipid Panel: No results for input(s): CHOL, HDL, LDLCALC, TRIG, CHOLHDL, LDLDIRECT in the last 72 hours. Thyroid Function Tests: No results for input(s): TSH, T4TOTAL, FREET4, T3FREE, THYROIDAB in the last 72 hours. Anemia Panel: No results for input(s): VITAMINB12, FOLATE, FERRITIN, TIBC, IRON, RETICCTPCT in the last 72  hours. Coagulation: No results for input(s): LABPROT, INR in the last 72 hours. Urine Drug Screen: Drugs of Abuse  No results found for: LABOPIA, COCAINSCRNUR, LABBENZ, AMPHETMU, THCU, LABBARB  Alcohol Level: No results for input(s): ETH in the last 72 hours. Urinalysis: No results for input(s): COLORURINE, LABSPEC, PHURINE, GLUCOSEU, HGBUR, BILIRUBINUR, KETONESUR, PROTEINUR, UROBILINOGEN, NITRITE, LEUKOCYTESUR in the last 72 hours.  Invalid input(s): APPERANCEUR Misc. Labs:   ABGS: No results for input(s): PHART, PO2ART, TCO2, HCO3 in the last 72 hours.  Invalid input(s): PCO2   MICROBIOLOGY: No results found for this or any previous visit (from the past 240 hour(s)).  Studies/Results: Ct Chest W Contrast  Result Date: 11/03/2016 CLINICAL DATA:  Shortness of breath for 2 days.  COPD. EXAM: CT CHEST WITH CONTRAST TECHNIQUE: Multidetector CT imaging of the chest was performed during intravenous contrast administration. CONTRAST:  80m ISOVUE-300 IOPAMIDOL (ISOVUE-300) INJECTION 61% COMPARISON:  None. FINDINGS: Cardiovascular: Normal heart size. No pericardial effusion. Coronary artery atherosclerosis in the left main, lad, circumflex and RCA. Normal caliber thoracic aorta with atherosclerosis. Mediastinum/Nodes: No enlarged mediastinal, hilar, or axillary lymph nodes. Thyroid gland, trachea, and esophagus demonstrate no significant findings. Lungs/Pleura: Severe bilateral centrilobular and paraseptal emphysema. 2.2 x 1.7 cm pulmonary nodule in the posterior segment of the left upper lobe most concerning for primary lung malignancy. No other pulmonary nodule or pulmonary mass. Upper Abdomen: Moderate size hiatal hernia. 2.6 cm hypodense left hepatic mass with peripheral nodular enhancement most consistent with a hemangioma. 2.2 cm hypodense right hepatic mass with peripheral nodular enhancement most consistent with a hemangioma. No acute upper abdominal abnormality. Fat containing left  para-aortic hernia. Musculoskeletal: No acute osseous abnormality. No lytic or sclerotic osseous lesion. 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 IMPRESSION: 1. 2.2 x 1.7 cm pulmonary nodule in the posterior segment of the left upper lobe most concerning for primary lung malignancy. Oncology consultation is recommended. 2. Severe bilateral emphysema. 3.  Aortic Atherosclerosis (ICD10-170.0) Electronically Signed   By: HKathreen Devoid  On: 11/03/2016 11:44   Ct Abdomen Pelvis W Contrast  Result Date: 11/04/2016 CLINICAL DATA:  Lung cancer.  Shortness of breath.  Hiatal hernia. EXAM: CT ABDOMEN AND PELVIS WITH CONTRAST TECHNIQUE: Multidetector CT imaging of the abdomen and pelvis was performed using the standard protocol following bolus administration of intravenous contrast. CONTRAST:  762mISOVUE-300 IOPAMIDOL (ISOVUE-300) INJECTION 61% COMPARISON:  CT abdomen pelvis 05/24/2009 FINDINGS: Lower chest: There is emphysema and bibasilar atelectasis. No visible pericardial effusion. There is a moderate-sized hiatal hernia. 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 The largest is at the anterior left hepatic lobe measuring up to 4 cm. No new hepatic lesions. Normal gallbladder. Pancreas: Normal pancreatic contours and enhancement. No peripancreatic fluid collection or pancreatic ductal dilatation. Spleen: Normal. Adrenals/Urinary Tract: Normal adrenal glands. Unchanged large lower pole left renal cyst measuring 5.4 cm. No hydronephrosis  or solid renal mass. Stomach/Bowel: No abnormal bowel dilatation. No bowel wall thickening or adjacent fat stranding to indicate acute inflammation. No abdominal fluid collection. Normal appendix. Vascular/Lymphatic: There is atherosclerotic calcification of the non aneurysmal abdominal aorta. Small partially calcified left para-aortic mass is unchanged in size measuring 1.5 by 0.6 cm Reproductive: Normal uterus. No adnexal mass. No free  fluid in the pelvis. Musculoskeletal: No lytic or blastic osseous lesion. Normal visualized extrathoracic and extraperitoneal soft tissues. Other: No contributory non-categorized findings. IMPRESSION: 1. No acute abnormality of the abdomen or pelvis. 2. Unchanged appearance of multiple hepatic hemangiomas. 3. No evidence of metastatic disease to the abdomen or pelvis. Electronically Signed   By: Ulyses Jarred M.D.   On: 11/04/2016 15:11    Medications:  Prior to Admission:  Prescriptions Prior to Admission  Medication Sig Dispense Refill Last Dose  . albuterol (PROVENTIL) (2.5 MG/3ML) 0.083% nebulizer solution Take 2.5 mg by nebulization every 4 (four) hours as needed. For shortness of breath   11/03/2016 at Unknown time  . budesonide-formoterol (SYMBICORT) 160-4.5 MCG/ACT inhaler Inhale 2 puffs into the lungs 2 (two) times daily.   11/03/2016 at Unknown time  . furosemide (LASIX) 20 MG tablet Take 20 mg by mouth 2 (two) times daily.   11/02/2016 at Unknown time  . lisinopril (PRINIVIL,ZESTRIL) 5 MG tablet Take 1 tablet (5 mg total) by mouth daily.   11/02/2016 at Unknown time  . SPIRIVA HANDIHALER 18 MCG inhalation capsule Take 1 capsule by mouth daily at 2 PM.   11/02/2016 at Unknown time  . Vitamin D, Ergocalciferol, (DRISDOL) 50000 UNITS CAPS Take 1 capsule (50,000 Units total) by mouth every 7 (seven) days. (Patient not taking: Reported on 11/03/2016) 30 capsule  Not Taking at Unknown time   Scheduled: . azithromycin  500 mg Oral Daily  . enoxaparin (LOVENOX) injection  20 mg Subcutaneous Q24H  . ipratropium  0.5 mg Nebulization TID  . lisinopril  5 mg Oral Daily  . mouth rinse  15 mL Mouth Rinse BID  . mometasone-formoterol  2 puff Inhalation BID  . predniSONE  60 mg Oral QAC breakfast  . sodium chloride flush  3 mL Intravenous Q12H  . tiotropium  18 mcg Inhalation Q1400   Continuous: . sodium chloride 50 mL/hr at 11/04/16 1854   EUM:[id number]  Assesment:She was admitted with COPD  exacerbation and was found to have a lung mass. I have personally reviewed the CT. I'm not sure that I can get to this lesion with a bronchoscope and she may be better served by having interventional radiology biopsy. I will discuss with interventional radiology and go ahead and put in an order. Active Problems:   COPD (chronic obstructive pulmonary disease) (HCC)   COPD exacerbation ([place])    Plan: As above    LOS: 2 days   Seleny Allbright L 11/05/2016, 8:50 AM

## 2016-11-06 ENCOUNTER — Encounter (HOSPITAL_COMMUNITY): Payer: Self-pay | Admitting: Primary Care

## 2016-11-06 DIAGNOSIS — M6281 Muscle weakness (generalized): Secondary | ICD-10-CM

## 2016-11-06 DIAGNOSIS — C3410 Malignant neoplasm of upper lobe, unspecified bronchus or lung: Secondary | ICD-10-CM

## 2016-11-06 DIAGNOSIS — Z515 Encounter for palliative care: Secondary | ICD-10-CM

## 2016-11-06 DIAGNOSIS — C3411 Malignant neoplasm of upper lobe, right bronchus or lung: Secondary | ICD-10-CM

## 2016-11-06 DIAGNOSIS — C3490 Malignant neoplasm of unspecified part of unspecified bronchus or lung: Secondary | ICD-10-CM

## 2016-11-06 DIAGNOSIS — Z7189 Other specified counseling: Secondary | ICD-10-CM

## 2016-11-06 DIAGNOSIS — J441 Chronic obstructive pulmonary disease with (acute) exacerbation: Principal | ICD-10-CM

## 2016-11-06 LAB — BASIC METABOLIC PANEL
Anion gap: 6 (ref 5–15)
BUN: 26 mg/dL — AB (ref 6–20)
CALCIUM: 8.3 mg/dL — AB (ref 8.9–10.3)
CO2: 23 mmol/L (ref 22–32)
Chloride: 109 mmol/L (ref 101–111)
Creatinine, Ser: 1.18 mg/dL — ABNORMAL HIGH (ref 0.44–1.00)
GFR calc Af Amer: 51 mL/min — ABNORMAL LOW (ref >60)
GFR, EST NON AFRICAN AMERICAN: 44 mL/min — AB (ref >60)
Glucose, Bld: 98 mg/dL (ref 65–99)
POTASSIUM: 4.7 mmol/L (ref 3.5–5.1)
SODIUM: 138 mmol/L (ref 135–145)

## 2016-11-06 MED ORDER — ALPRAZOLAM 0.25 MG PO TABS
0.2500 mg | ORAL_TABLET | Freq: Every evening | ORAL | Status: DC | PRN
  Administered 2016-11-06: 0.25 mg via ORAL
  Filled 2016-11-06: qty 1

## 2016-11-06 MED ORDER — FUROSEMIDE 20 MG PO TABS
20.0000 mg | ORAL_TABLET | Freq: Two times a day (BID) | ORAL | Status: DC
  Administered 2016-11-06 – 2016-11-08 (×3): 20 mg via ORAL
  Filled 2016-11-06 (×3): qty 1

## 2016-11-06 NOTE — Consult Note (Signed)
Consultation Note Date: 11/06/2016   Patient Name: Cheyenne Case  DOB: Feb 07, 1941  MRN: [id number]  Age / Sex: 76 y.o., female  PCP: [name], MD Referring Physician: Eber Jones, MD  Reason for Consultation: Establishing goals of care and Psychosocial/spiritual support  HPI/Patient Profile: 76 y.o. female  with past medical history of CKD, COPD/emphysema, hypertension admitted on 11/03/2016 with acute exacerbation of COPD/right upper lobe mouth, likely cancer.   Clinical Assessment and Goals of Care: Cheyenne Case is sitting up on the edge of the bed. She greets me making and keeping eye contact as I enter. She is pleasant. Her brother, Porchea Charrier is at bedside.  Cheyenne Case describes an episode earlier today of trouble swallowing. Her brother states that he is had this problem in the past and had esophageal strictures stretched by Dr. Melony Overly. We talk about sitting upright for eating, taking small bites, and eating soft foods. I share my concern about any esophageal stretching for her,  she agrees that would not be a good choice for her.   We talk about her current health concerns, and future testing. Cheyenne Case has stated that she is not interested in pursuing further treatment options. Her brother states that she should have the pet scan before she knows what her treatment plan should be. He states, "she's never been married and only owns her clothes.  I help take care of her, I should be able to have a say in decisions".  I share that Cheyenne Case makes decisions about her health, and future treatment plans. Brother [name] becomes angry and leaves stating that he has no say.  Cheyenne Case and I discussed her safety at home. She states several times that she feels safe at home. She goes further to say that Mr. Selvey wife died  suddenly in her early 69s and he has been, "an angry man".   We discuss  healthcare power of attorney. Cheyenne Case states she would like her neice, Government social research officer, to be her Media planner. Paperwork is given by nursing. We also talk about advanced directives. Cheyenne Case states she would not want CPR or intubation. I ask if her brother can abide these wishes and she said she believes so.  We talk about the benefits of home health, and the benefits of hospice in home. Cheyenne Case has decided at this time, home with home health services. She states she is willing to go to a facility if/when needed.  We talk about the use of medications for anxiety. I encourage Cheyenne Case to use PRN medication, describing how this may benefit her, and what she would expect from taking such medication. She agrees to consider use.  Healthcare power of attorney OTHER - Cheyenne Case states her preferences for her niece, Little Ishikawa, to be her Ambulance person. Paperwork provided but not completed.   SUMMARY OF RECOMMENDATIONS   Continue to treat the treatable but no extraordinary measures such as CPR or intubation. Cheyenne Case states that she would not have surgery,  or cancer treatment at this time.  Code Status/Advance Care Planning:  DNR - discussed the concept of allowing natural death. We also discuss the benefits of hospice  Symptom Management:   per hospitalist, Xanax 0.25 mg QHS PRN added  Palliative Prophylaxis:   Frequent Pain Assessment  Additional Recommendations (Limitations, Scope, Preferences):  Treat the treatable, but no surgery, no cancer treatment, no extraordinary measures such as CPR or intubation  Psycho-social/Spiritual:   Desire for further Chaplaincy support:no  Additional Recommendations: Caregiving  Support/Resources and Education on Hospice  Prognosis:   < 6 months, or less would not be surprising based on functional decline, new diagnosis of cancer no desire to seek further treatment.  Discharge Planning: Home with the benefits of home  health, we discussed the benefits of hospice, open to ALF in the future.      Primary Diagnoses: Present on Admission: . COPD (chronic obstructive pulmonary disease) ([place]) . COPD exacerbation ([place])   I have reviewed the medical record, interviewed the patient and family, and examined the patient. The following aspects are pertinent.  Past Medical History:  Diagnosis Date  . Chronic kidney disease 11/2011.   Creatinine 1.4-1.8.  Marland Kitchen COPD (chronic obstructive pulmonary disease) ([place])   . Emphysema   . Hypertension   . Physical deconditioning 12/24/2011  . Vitamin D deficiency 12/24/2011   Social History   Social History  . Marital status: Single    Spouse name: N/A  . Number of children: N/A  . Years of education: N/A   Social History Main Topics  . Smoking status: Former Research scientist (life sciences)  . Smokeless tobacco: Never Used  . Alcohol use No  . Drug use: No  . Sexual activity: Not Asked   Other Topics Concern  . None   Social History Narrative  . None   History reviewed. No pertinent family history. Scheduled Meds: . albuterol  2.5 mg Nebulization TID  . azithromycin  500 mg Oral Daily  . enoxaparin (LOVENOX) injection  20 mg Subcutaneous Q24H  . furosemide  20 mg Oral BID  . lisinopril  5 mg Oral Daily  . mouth rinse  15 mL Mouth Rinse BID  . mometasone-formoterol  2 puff Inhalation BID  . predniSONE  60 mg Oral QAC breakfast  . sodium chloride flush  3 mL Intravenous Q12H  . tiotropium  18 mcg Inhalation Q1400   Continuous Infusions: PRN Meds:.albuterol, ALPRAZolam Medications Prior to Admission:  Prior to Admission medications   Medication Sig Start Date End Date Taking? Authorizing Provider  albuterol (PROVENTIL) (2.5 MG/3ML) 0.083% nebulizer solution Take 2.5 mg by nebulization every 4 (four) hours as needed. For shortness of breath   Yes Historical Provider, MD  budesonide-formoterol (SYMBICORT) 160-4.5 MCG/ACT inhaler Inhale 2 puffs into the lungs 2 (two) times daily.    Yes Historical Provider, MD  furosemide (LASIX) 20 MG tablet Take 20 mg by mouth 2 (two) times daily.   Yes Historical Provider, MD  lisinopril (PRINIVIL,ZESTRIL) 5 MG tablet Take 1 tablet (5 mg total) by mouth daily. 12/25/11  Yes Rexene Alberts, MD  SPIRIVA HANDIHALER 18 MCG inhalation capsule Take 1 capsule by mouth daily at 2 PM. 09/14/16  Yes Historical Provider, MD  Vitamin D, Ergocalciferol, (DRISDOL) 50000 UNITS CAPS Take 1 capsule (50,000 Units total) by mouth every 7 (seven) days. Patient not taking: Reported on 11/03/2016 12/25/11   Rexene Alberts, MD   No Known Allergies Review of Systems  Unable to perform ROS: Other    Physical  Exam  Constitutional: She is oriented to person, place, and time.  HENT:  Head: Normocephalic and atraumatic.  Cardiovascular: Normal rate and regular rhythm.   Rate 100's to 110's at times  Pulmonary/Chest: Effort normal. No respiratory distress.  Abdominal: Soft. She exhibits no distension.  Musculoskeletal: She exhibits edema.  Neurological: She is alert and oriented to person, place, and time.  Skin: Skin is warm and dry.  Thin-skinned,dry  Psychiatric: She has a normal mood and affect. Her behavior is normal. Judgment and thought content normal.  Nursing note and vitals reviewed.   Vital Signs: BP (!) 150/63 (BP Location: Right Arm)   Pulse (!) 114   Temp 97.9 F (36.6 C) (Oral)   Resp 20   Ht 5' 3" (1.6 m)   Wt 24 kg (53 lb)   SpO2 97%   BMI 9.39 kg/m  Pain Assessment: No/denies pain   Pain Score: 0-No pain   SpO2: SpO2: 97 % O2 Device:SpO2: 97 % O2 Flow Rate: .O2 Flow Rate (L/min): 2 L/min  IO: Intake/output summary:  Intake/Output Summary (Last 24 hours) at 11/06/16 1510 Last data filed at 11/06/16 1300  Gross per 24 hour  Intake              720 ml  Output             1050 ml  Net             -330 ml    LBM: Last BM Date: 11/05/16 Baseline Weight: Weight: 24 kg (53 lb) Most recent weight: Weight: 24 kg (53 lb)       Palliative Assessment/Data:   Flowsheet Rows   Flowsheet Row Most Recent Value  Intake Tab  Referral Department  Hospitalist  Unit at Time of Referral  Med/Surg Unit  Palliative Care Primary Diagnosis  Cancer  Date Notified  11/06/16  Palliative Care Type  New Palliative care  Reason for referral  Clarify Goals of Care, Advance Care Planning  Date of Admission  11/03/16  Date first seen by Palliative Care  11/06/16  # of days Palliative referral response time  0 Day(s)  # of days IP prior to Palliative referral  3  Clinical Assessment  Palliative Performance Scale Score  50%  Pain Max last 24 hours  Not able to report  Pain Min Last 24 hours  Not able to report  Dyspnea Max Last 24 Hours  Not able to report  Dyspnea Min Last 24 hours  Not able to report  Psychosocial & Spiritual Assessment  Palliative Care Outcomes  Patient/Family meeting held?  Yes  Who was at the meeting?  Patient, brother briefly.  Palliative Care Outcomes  Clarified goals of care, Changed CPR status, Completed durable DNR, Provided advance care planning, Counseled regarding hospice, Provided psychosocial or spiritual support  Patient/Family wishes: Interventions discontinued/not started   Mechanical Ventilation  Palliative Care follow-up planned  -- [Follow-up while at [facility]]      Time In: 1410 Time Out: 1445 Time Total: 35 minutes Greater than 50%  of this time was spent counseling and coordinating care related to the above assessment and plan.  Signed by: Drue Novel, NP   Please contact Palliative Medicine Team phone at [phone] for questions and concerns.  For individual provider: See Shea Evans

## 2016-11-06 NOTE — Progress Notes (Signed)
Pt states after taking medicines she feels "heavy in chest" and her heart rate feels "slow and heavy beating". Pt vitals were BP 138/78 and pulse was 113. Pt is sitting up on side of bed feeling distressed. MD going to be notified. Will continue to monitor.

## 2016-11-06 NOTE — Care Management Important Message (Signed)
Important Message  Patient Details  Name: Cheyenne Case MRN: [id number] Date of Birth: 1941/09/03   Medicare Important Message Given:  Yes    Sherald Barge, RN 11/06/2016, 1:43 PM

## 2016-11-06 NOTE — Progress Notes (Signed)
PROGRESS NOTE    Cheyenne Case  BJS:[id number] DOB: August 21, 1941 DOA: 11/03/2016 PCP: Leonides Grills, MD    Brief Narrative:  35 ? COPD, Pulm Htn, H/o steroid induced hypoglycemia, ckd III.  Dyspnea over 1 week superimposed on a background of about 1 year progressive worsening of function. She lives with her brother for the past 7 years and over the past 1 year has not been able to walk more than 10 steps without being short of breath or giving out. She does not use home oxygen. Treated recently by Dr. Hilma Favors office with antibiotics and steroids early January. No one was ill around her or had the flu or any other viral illnesses. Does not think she ever really recovered from the same. No diarrhea, no chest pain, no myalgias  Workup in the emergency room showed BUN/creatinine 35/1.3, WBC 8.7 hemoglobin 13.3, CT and cxr concerning 2.2 x 1.7 cm LUL--superimposed on severe emphysema.  Pulmonology consulted for evaluation for possible bronchoscopy.   Assessment & Plan:   Active Problems:   COPD (chronic obstructive pulmonary disease) ([facility])   COPD exacerbation ([facility])   Lung cancer ([facility])   Palliative care encounter   Goals of care, counseling/discussion   DNR (do not resuscitate) discussion   Acute exacerbation of COPD superimposed on severe emphysema - Start DuoNeb every 4 hourly scheduled, continue Spiriva 1 capsule daily - Hold albuterol neb - Substitute dulera for Symbicort - Continue burst of steroids with prednisone 60 for 5 days  2 cm mass  right upper lobe - With 60-pack-year history of smoking-probable diagnosis is cancer - Dr. Luan Pulling consulted and discussed workup options with patient - CT of abdomen with contrast showing no metastasis - patient will unlikely get bronchoscopy due to location of lesion - Dr Luan Pulling discussed with IR and patient may benefit from PET scan outpatient and follow up at thoracic oncology; if no other lesions identified may need radiation  oncology to evaluate for tx without biopsy - patient states she does not want any workup or treatment and wants to live her life as she was - patient's brother repeatedly states outside the room patient must have the PET scan done; he mentions she isn't in her right mind and that any person who was would want the PET scan done to find out where else the cancer is and get it treated - discussed with both patient and her brother getting palliative care involved to discuss new diagnosis as well as establishing goals of care  Hypertension - Continue lisinopril 5 daily  CKD3  - Lasix 20 twice a day has been held for now - restarted lasix  Prior chronic T6 T9 vertebral compression fractures - Poor current evidence for vitamin D or calcium supplementation so hold vitamin D for now  Steroid-induced hyperglycemia - continue to monitor CBG's  Inpatient pending workup - PT recommending HH PT - will consult CM for home oxygen PT needs   DVT prophylaxis: lovenox Code Status: Full code Family Communication: brother is bedside Disposition Plan: may be stable for discharge in the next 24 hours   Consultants:   Pulmonology  PT  Procedures:   None  Antimicrobials:   None    Subjective: Discussed with patient the plan for evaluation and treatment that Dr. Luan Pulling outlined in his note.  Patient states she would not like to pursue any testing.  She voices concern this am about her medication and says she feels shaky and that her heart is beating too slowly.  She denies chest pain, nausea.  She does not want these symptoms to be worked up because she states her heart has always been "perfectly normal".  Her brother states that patient "doesn't do anything and so if her heart starts really beating she doesn't know what to do about it".   Objective: Vitals:   11/06/16 0957 11/06/16 1343 11/06/16 1426 11/06/16 1446  BP: 138/78 134/74  (!) 150/63  Pulse: (!) 113 91  (!) 114  Resp: _0 Temp: 97.9 F (36.6 C) 98.2 F (36.8 C)  97.9 F (36.6 C)  TempSrc: Oral Oral  Oral  SpO2: 97% 96% 98% 97%  Weight:      Height:        Intake/Output Summary (Last 24 hours) at 11/06/16 1541 Last data filed at 11/06/16 1300  Gross per 24 hour  Intake              720 ml  Output             1050 ml  Net             -330 ml   Filed Weights   11/03/16 1647  Weight: 24 kg (53 lb)    Examination:  General exam: Slightly increased work of breathing with talking in sentences Respiratory system: poor air movement, slightly increased work of breathing, diffuse rhonchi Cardiovascular system: S1 & S2 heard, RRR. No JVD, murmurs, rubs, gallops or clicks. No pedal edema. Gastrointestinal system: Abdomen is nondistended, soft and nontender. No organomegaly or masses felt. Normal bowel sounds heard. Central nervous system: Alert and oriented. No focal neurological deficits. Extremities: Symmetric 5 x 5 power. Right arm swelling slightly improved Skin: No rashes, lesions or ulcers but scattered ecchymoses Psychiatry: Judgement and insight appear normal. Mood & affect appropriate.     Data Reviewed: I have personally reviewed following labs and imaging studies  CBC:  Recent Labs Lab 11/03/16 0746 11/04/16 0440  WBC 8.7 10.3  NEUTROABS 6.6  --   HGB 13.3 11.8*  HCT 40.8 35.8*  MCV 99.0 98.4  PLT 477* 076   Basic Metabolic Panel:  Recent Labs Lab 11/03/16 0746 11/04/16 0440 11/06/16 0549  NA 137 137 138  K 3.6 3.9 4.7  CL 99* 100* 109  CO2 _1 GLUCOSE 182* 142* 98  BUN 35* 34* 26*  CREATININE 1.36* 1.38* 1.18*  CALCIUM 9.0 8.5* 8.3*   GFR: Estimated Creatinine Clearance: 15.4 mL/min (by C-G formula based on SCr of 1.18 mg/dL (H)). Liver Function Tests:  Recent Labs Lab 11/04/16 0440  AST 22  ALT 15  ALKPHOS 68  BILITOT 1.2  PROT 6.0*  ALBUMIN 3.2*   No results for input(s): LIPASE, AMYLASE in the last 168 hours. No results for input(s):  AMMONIA in the last 168 hours. Coagulation Profile: No results for input(s): INR, PROTIME in the last 168 hours. Cardiac Enzymes:  Recent Labs Lab 11/03/16 0746  TROPONINI <0.03   BNP (last 3 results) No results for input(s): PROBNP in the last 8760 hours. HbA1C: No results for input(s): HGBA1C in the last 72 hours. CBG: No results for input(s): GLUCAP in the last 168 hours. Lipid Profile: No results for input(s): CHOL, HDL, LDLCALC, TRIG, CHOLHDL, LDLDIRECT in the last 72 hours. Thyroid Function Tests: No results for input(s): TSH, T4TOTAL, FREET4, T3FREE, THYROIDAB in the last 72 hours. Anemia Panel: No results for input(s): VITAMINB12, FOLATE, FERRITIN, TIBC, IRON, RETICCTPCT in the last 72  hours. Sepsis Labs: No results for input(s): PROCALCITON, LATICACIDVEN in the last 168 hours.  No results found for this or any previous visit (from the past 240 hour(s)).       Radiology Studies: No results found.      Scheduled Meds: . albuterol  2.5 mg Nebulization TID  . azithromycin  500 mg Oral Daily  . enoxaparin (LOVENOX) injection  20 mg Subcutaneous Q24H  . furosemide  20 mg Oral BID  . lisinopril  5 mg Oral Daily  . mouth rinse  15 mL Mouth Rinse BID  . mometasone-formoterol  2 puff Inhalation BID  . predniSONE  60 mg Oral QAC breakfast  . sodium chloride flush  3 mL Intravenous Q12H  . tiotropium  18 mcg Inhalation Q1400   Continuous Infusions:    LOS: 3 days    Time spent: 30 minutes    Loretha Stapler, MD [facility] Pager [phone]  If 7PM-7AM, please contact night-coverage [email or web address] Password [email or web address] 11/06/2016, 3:41 PM

## 2016-11-06 NOTE — Progress Notes (Signed)
Subjective: She says she feels a little better. She rates herself at approximately 5 out of 10. Her brother who provides her care is in the room with her and he is concerned about how they will manage at home. We discussed the possibility of PET scan and then even without biopsy possibly having radiation treatment. Her brother is concerned that if she has further lung damage from radiation that she may be even worse and so we discussed that I gave her the option that she could elect not to do anything about the cancer but that she did not need to make a decision right now.  Objective: Vital signs in last 24 hours: Temp:  [97.8 F (36.6 C)-98.1 F (36.7 C)] 97.8 F (36.6 C) (02/09 0516) Pulse Rate:  [61-92] 82 (02/09 0516) Resp:  [18-21] 18 (02/09 0516) BP: (121-150)/(55-72) 150/63 (02/09 0516) SpO2:  [83 %-100 %] 100 % (02/09 0516) Weight change:  Last BM Date: 11/05/16  Intake/Output from previous day: 02/08 0701 - 02/09 0700 In: 720 [P.O.:720] Out: 1400 [Urine:1400]  PHYSICAL EXAM General appearance: alert, cooperative and mild distress Resp: rhonchi bilaterally Cardio: regular rate and rhythm, S1, S2 normal, no murmur, click, rub or gallop GI: soft, non-tender; bowel sounds normal; no masses,  no organomegaly Extremities: extremities normal, atraumatic, no cyanosis or edema Skin warm and dry. Mucous membranes are moist  Lab Results:  Results for orders placed or performed during the hospital encounter of 11/03/16 (from the past 48 hour(s))  Basic metabolic panel     Status: Abnormal   Collection Time: 11/06/16  5:49 AM  Result Value Ref Range   Sodium 138 135 - 145 mmol/L   Potassium 4.7 3.5 - 5.1 mmol/L   Chloride 109 101 - 111 mmol/L   CO2 23 22 - 32 mmol/L   Glucose, Bld 98 65 - 99 mg/dL   BUN 26 (H) 6 - 20 mg/dL   Creatinine, Ser 1.18 (H) 0.44 - 1.00 mg/dL   Calcium 8.3 (L) 8.9 - 10.3 mg/dL   GFR calc non Af Amer 44 (L) >60 mL/min   GFR calc Af Amer 51 (L) >60  mL/min    Comment: (NOTE) The eGFR has been calculated using the CKD EPI equation. This calculation has not been validated in all clinical situations. eGFR's persistently <60 mL/min signify possible Chronic Kidney Disease.    Anion gap 6 5 - 15    ABGS No results for input(s): PHART, PO2ART, TCO2, HCO3 in the last 72 hours.  Invalid input(s): PCO2 CULTURES No results found for this or any previous visit (from the past 240 hour(s)). Studies/Results: Ct Abdomen Pelvis W Contrast  Result Date: 11/04/2016 CLINICAL DATA:  Lung cancer.  Shortness of breath.  Hiatal hernia. EXAM: CT ABDOMEN AND PELVIS WITH CONTRAST TECHNIQUE: Multidetector CT imaging of the abdomen and pelvis was performed using the standard protocol following bolus administration of intravenous contrast. CONTRAST:  68m ISOVUE-300 IOPAMIDOL (ISOVUE-300) INJECTION 61% COMPARISON:  CT abdomen pelvis 05/24/2009 FINDINGS: Lower chest: There is emphysema and bibasilar atelectasis. No visible pericardial effusion. There is a moderate-sized hiatal hernia. 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 The largest is at the anterior left hepatic lobe measuring up to 4 cm. No new hepatic lesions. Normal gallbladder. Pancreas: Normal pancreatic contours and enhancement. No peripancreatic fluid collection or pancreatic ductal dilatation. Spleen: Normal. Adrenals/Urinary Tract: Normal adrenal glands. Unchanged large lower pole left renal cyst measuring 5.4 cm. No hydronephrosis or solid renal mass. Stomach/Bowel:  No abnormal bowel dilatation. No bowel wall thickening or adjacent fat stranding to indicate acute inflammation. No abdominal fluid collection. Normal appendix. Vascular/Lymphatic: There is atherosclerotic calcification of the non aneurysmal abdominal aorta. Small partially calcified left para-aortic mass is unchanged in size measuring 1.5 by 0.6 cm Reproductive: Normal uterus. No adnexal mass. No free fluid  in the pelvis. Musculoskeletal: No lytic or blastic osseous lesion. Normal visualized extrathoracic and extraperitoneal soft tissues. Other: No contributory non-categorized findings. IMPRESSION: 1. No acute abnormality of the abdomen or pelvis. 2. Unchanged appearance of multiple hepatic hemangiomas. 3. No evidence of metastatic disease to the abdomen or pelvis. Electronically Signed   By: Ulyses Jarred M.D.   On: 11/04/2016 15:11    Medications:  Prior to Admission:  Prescriptions Prior to Admission  Medication Sig Dispense Refill Last Dose  . albuterol (PROVENTIL) (2.5 MG/3ML) 0.083% nebulizer solution Take 2.5 mg by nebulization every 4 (four) hours as needed. For shortness of breath   11/03/2016 at Unknown time  . budesonide-formoterol (SYMBICORT) 160-4.5 MCG/ACT inhaler Inhale 2 puffs into the lungs 2 (two) times daily.   11/03/2016 at Unknown time  . furosemide (LASIX) 20 MG tablet Take 20 mg by mouth 2 (two) times daily.   11/02/2016 at Unknown time  . lisinopril (PRINIVIL,ZESTRIL) 5 MG tablet Take 1 tablet (5 mg total) by mouth daily.   11/02/2016 at Unknown time  . SPIRIVA HANDIHALER 18 MCG inhalation capsule Take 1 capsule by mouth daily at 2 PM.   11/02/2016 at Unknown time  . Vitamin D, Ergocalciferol, (DRISDOL) 50000 UNITS CAPS Take 1 capsule (50,000 Units total) by mouth every 7 (seven) days. (Patient not taking: Reported on 11/03/2016) 30 capsule  Not Taking at Unknown time   Scheduled: . albuterol  2.5 mg Nebulization TID  . azithromycin  500 mg Oral Daily  . enoxaparin (LOVENOX) injection  20 mg Subcutaneous Q24H  . furosemide  20 mg Oral BID  . lisinopril  5 mg Oral Daily  . mouth rinse  15 mL Mouth Rinse BID  . mometasone-formoterol  2 puff Inhalation BID  . predniSONE  60 mg Oral QAC breakfast  . sodium chloride flush  3 mL Intravenous Q12H  . tiotropium  18 mcg Inhalation Q1400   Continuous:  [id number]  Assesment: She was admitted with COPD exacerbation. She is improving  slowly. She has a lesion on CT that looks like a lung cancer. She is high risk for biopsy because of her emphysema. Discussion today with the patient and her brother regarding workup and treatment. Active Problems:   COPD (chronic obstructive pulmonary disease) ([facility])   COPD exacerbation ([facility])    Plan: Continue current treatments. She is interested in a portable oxygen concentrator when she goes home    LOS: 3 days   Shanequa Whitenight L 11/06/2016, 8:38 AM

## 2016-11-06 NOTE — Progress Notes (Signed)
Cheyenne Case states that she takes furosemide 40 mg/day at home and she has not been taking it during this hospitalization. She says she feels "full" and believes it is because she has not had her fluid pill and she is building up too much fluid.

## 2016-11-06 NOTE — Progress Notes (Signed)
Pt complains of her lunch "getting stuck and not going down" and then spits up. It doesn't look like emesis, more mucus type. Pt states she thinks its her lunch and the type of food not going down well. Offered more softer, bland snacks and will continue to monitor how she does at dinner time.

## 2016-11-06 NOTE — Progress Notes (Signed)
SATURATION QUALIFICATIONS: (This note is used to comply with regulatory documentation for home oxygen)  Patient Saturations on Room Air at Rest = 89___%  Patient Saturations on Room Air while Ambulating =_85__%  Patient Saturations on ___ Liters of oxygen while Ambulating = __93%

## 2016-11-07 LAB — COMPREHENSIVE METABOLIC PANEL
ALK PHOS: 59 U/L (ref 38–126)
ALT: 32 U/L (ref 14–54)
AST: 40 U/L (ref 15–41)
Albumin: 3.3 g/dL — ABNORMAL LOW (ref 3.5–5.0)
Anion gap: 8 (ref 5–15)
BUN: 23 mg/dL — AB (ref 6–20)
CALCIUM: 8.7 mg/dL — AB (ref 8.9–10.3)
CO2: 27 mmol/L (ref 22–32)
Chloride: 104 mmol/L (ref 101–111)
Creatinine, Ser: 1.26 mg/dL — ABNORMAL HIGH (ref 0.44–1.00)
GFR, EST AFRICAN AMERICAN: 47 mL/min — AB (ref >60)
GFR, EST NON AFRICAN AMERICAN: 40 mL/min — AB (ref >60)
Glucose, Bld: 150 mg/dL — ABNORMAL HIGH (ref 65–99)
Potassium: 3.6 mmol/L (ref 3.5–5.1)
Sodium: 139 mmol/L (ref 135–145)
Total Bilirubin: 0.8 mg/dL (ref 0.3–1.2)
Total Protein: 6.2 g/dL — ABNORMAL LOW (ref 6.5–8.1)

## 2016-11-07 LAB — CBC WITH DIFFERENTIAL/PLATELET
BASOS ABS: 0 10*3/uL (ref 0.0–0.1)
Basophils Relative: 0 %
Eosinophils Absolute: 0.1 10*3/uL (ref 0.0–0.7)
Eosinophils Relative: 1 %
HCT: 38.2 % (ref 36.0–46.0)
HEMOGLOBIN: 12.3 g/dL (ref 12.0–15.0)
LYMPHS ABS: 1.9 10*3/uL (ref 0.7–4.0)
LYMPHS PCT: 18 %
MCH: 31.9 pg (ref 26.0–34.0)
MCHC: 32.2 g/dL (ref 30.0–36.0)
MCV: 99.2 fL (ref 78.0–100.0)
Monocytes Absolute: 1 10*3/uL (ref 0.1–1.0)
Monocytes Relative: 10 %
NEUTROS ABS: 7.4 10*3/uL (ref 1.7–7.7)
NEUTROS PCT: 71 %
Platelets: 374 10*3/uL (ref 150–400)
RBC: 3.85 MIL/uL — AB (ref 3.87–5.11)
RDW: 14 % (ref 11.5–15.5)
WBC: 10.4 10*3/uL (ref 4.0–10.5)

## 2016-11-07 LAB — GLUCOSE, CAPILLARY
GLUCOSE-CAPILLARY: 56 mg/dL — AB (ref 65–99)
Glucose-Capillary: 91 mg/dL (ref 65–99)

## 2016-11-07 MED ORDER — LORAZEPAM 0.5 MG PO TABS: 0.2500 mg | ORAL_TABLET | Freq: Once | ORAL | Status: DC | PRN

## 2016-11-07 NOTE — Progress Notes (Signed)
Pt has an order for imminent D/C related to a PT eval. PT already left for the day, so pt will remain here one more night until seen in the am. Will continue to monitor.

## 2016-11-07 NOTE — Progress Notes (Signed)
PROGRESS NOTE    Cheyenne Case  MSX:[id number] DOB: Nov 18, 1940 DOA: 11/03/2016 PCP: Leonides Grills, MD    Brief Narrative:  17 ? COPD, Pulm Htn, H/o steroid induced hypoglycemia, ckd III.  Dyspnea over 1 week superimposed on a background of about 1 year progressive worsening of function. She lives with her brother for the past 7 years and over the past 1 year has not been able to walk more than 10 steps without being short of breath or giving out. She does not use home oxygen. Treated recently by Dr. Hilma Favors office with antibiotics and steroids early January. No one was ill around her or had the flu or any other viral illnesses. Does not think she ever really recovered from the same. No diarrhea, no chest pain, no myalgias  Workup in the emergency room showed BUN/creatinine 35/1.3, WBC 8.7 hemoglobin 13.3, CT and cxr concerning 2.2 x 1.7 cm LUL--superimposed on severe emphysema.  Pulmonology consulted for evaluation for possible bronchoscopy.   Assessment & Plan:   Active Problems:   COPD (chronic obstructive pulmonary disease) (HCC)   COPD exacerbation (HCC)   Lung cancer (HCC)   Palliative care encounter   Goals of care, counseling/discussion   DNR (do not resuscitate) discussion   Muscle weakness (generalized)  2 cm mass  right upper lobe - With 60-pack-year history of smoking-probable diagnosis is cancer - Dr. Luan Pulling consulted and discussed workup options with patient - CT of abdomen with contrast showing no metastasis - patient will unlikely get bronchoscopy due to location of lesion - Dr Luan Pulling discussed with IR and patient may benefit from PET scan outpatient and follow up at thoracic oncology; if no other lesions identified may need radiation oncology to evaluate for tx without biopsy - patient states she does not want any workup or treatment and wants to live her life as she was - patient's brother repeatedly states outside the room patient must have the PET scan  done; he mentions she isn't in her right mind and that any person who was would want the PET scan done to find out where else the cancer is and get it treated - discussed with both patient and her brother getting palliative care involved to discuss new diagnosis as well as establishing goals of care - patient has decided to become DNR and does not wish to pursue further investigation of lung mass  Acute exacerbation of COPD superimposed on severe emphysema - Start DuoNeb every 4 hourly scheduled, continue Spiriva 1 capsule daily - Substitute dulera for Symbicort - Continue burst of steroids with prednisone 60 for 5 days - xolpenex as needed  Hypertension - Continue lisinopril 5 daily  CKD3  - restarted lasix daily  Prior chronic T6 T9 vertebral compression fractures - Poor current evidence for vitamin D or calcium supplementation so hold vitamin D for now  Steroid-induced hyperglycemia - continue to monitor CBG's  Weakness - patient has been unable to ambulate to the bathroom since yesterday afternoon - patient becoming more weak and requiring assistance even for the bedside commode - patient and family requesting re- evaluation by PT- pending; once completed can discharge   DVT prophylaxis: lovenox Code Status: DNR Family Communication: niece is bedside Disposition Plan: pending re- evaluation by PT; discharge tomorrow to either SNF or to niece's house   Consultants:   Pulmonology  PT  Procedures:   None  Antimicrobials:   Azithromycin    Subjective:  Patient reports that she is "more shaky" today.  She and her niece are asking for re-evaluation by PT before patient discharges.  Patient has been unable to get up to walk to the bathroom due to weakness and shortness of breath.    Objective: Vitals:   11/07/16 1103 11/07/16 1105 11/07/16 1400 11/07/16 1522  BP:   (!) 133/59   Pulse:   84   Resp:   16   Temp:   98.4 F (36.9 C)   TempSrc:      SpO2: 99%  99% 100% 99%  Weight:      Height:        Intake/Output Summary (Last 24 hours) at 11/07/16 1624 Last data filed at 11/07/16 1300  Gross per 24 hour  Intake              720 ml  Output              400 ml  Net              320 ml   Filed Weights   11/03/16 1647  Weight: 24 kg (53 lb)    Examination:  General exam: mild distress, very anxious elderly woman Respiratory system: poor air movement, slightly increased work of breathing, using accessory muscles to breath Cardiovascular system: S1 & S2 heard, RRR. No JVD, murmurs, rubs, gallops or clicks. No pedal edema. Gastrointestinal system: Abdomen is nondistended, soft and nontender. No organomegaly or masses felt. Normal bowel sounds heard. Central nervous system: Alert and oriented. No focal neurological deficits. Extremities: Symmetric 5 x 5 power. Right arm swelling slightly improved, some subcutaneous fluid is noted in patients arms bilaterally Skin: No rashes, lesions or ulcers but scattered ecchymoses Psychiatry: extremely anxious.     Data Reviewed: I have personally reviewed following labs and imaging studies  CBC:  Recent Labs Lab 11/03/16 0746 11/04/16 0440 11/07/16 1316  WBC 8.7 10.3 10.4  NEUTROABS 6.6  --  7.4  HGB 13.3 11.8* 12.3  HCT 40.8 35.8* 38.2  MCV 99.0 98.4 99.2  PLT 477* 390 756   Basic Metabolic Panel:  Recent Labs Lab 11/03/16 0746 11/04/16 0440 11/06/16 0549 11/07/16 1316  NA 137 137 138 139  K 3.6 3.9 4.7 3.6  CL 99* 100* 109 104  CO2 _0 GLUCOSE 182* 142* 98 150*  BUN 35* 34* 26* 23*  CREATININE 1.36* 1.38* 1.18* 1.26*  CALCIUM 9.0 8.5* 8.3* 8.7*   GFR: Estimated Creatinine Clearance: 14.4 mL/min (by C-G formula based on SCr of 1.26 mg/dL (H)). Liver Function Tests:  Recent Labs Lab 11/04/16 0440 11/07/16 1316  AST 22 40  ALT 15 32  ALKPHOS 68 59  BILITOT 1.2 0.8  PROT 6.0* 6.2*  ALBUMIN 3.2* 3.3*   No results for input(s): LIPASE, AMYLASE in the last  168 hours. No results for input(s): AMMONIA in the last 168 hours. Coagulation Profile: No results for input(s): INR, PROTIME in the last 168 hours. Cardiac Enzymes:  Recent Labs Lab 11/03/16 0746  TROPONINI <0.03   BNP (last 3 results) No results for input(s): PROBNP in the last 8760 hours. HbA1C: No results for input(s): HGBA1C in the last 72 hours. CBG:  Recent Labs Lab 11/07/16 1607  GLUCAP 56*   Lipid Profile: No results for input(s): CHOL, HDL, LDLCALC, TRIG, CHOLHDL, LDLDIRECT in the last 72 hours. Thyroid Function Tests: No results for input(s): TSH, T4TOTAL, FREET4, T3FREE, THYROIDAB in the last 72 hours. Anemia Panel: No results for input(s): VITAMINB12, FOLATE, FERRITIN, TIBC,  IRON, RETICCTPCT in the last 72 hours. Sepsis Labs: No results for input(s): PROCALCITON, LATICACIDVEN in the last 168 hours.  No results found for this or any previous visit (from the past 240 hour(s)).       Radiology Studies: No results found.      Scheduled Meds: . albuterol  2.5 mg Nebulization TID  . azithromycin  500 mg Oral Daily  . enoxaparin (LOVENOX) injection  20 mg Subcutaneous Q24H  . furosemide  20 mg Oral BID  . lisinopril  5 mg Oral Daily  . mouth rinse  15 mL Mouth Rinse BID  . mometasone-formoterol  2 puff Inhalation BID  . predniSONE  60 mg Oral QAC breakfast  . sodium chloride flush  3 mL Intravenous Q12H  . tiotropium  18 mcg Inhalation Q1400   Continuous Infusions:    LOS: 4 days    Time spent: 30 minutes    Loretha Stapler, MD [facility] Pager [phone]  If 7PM-7AM, please contact night-coverage [email or web address] Password [facility] 11/07/2016, 4:24 PM

## 2016-11-07 NOTE — Progress Notes (Signed)
Pt is explaining that she is worried about going home as she feels her brother will not be able to take adequate care for her and she needs to gain her strength back. She is requesting SNF or rehab. MD made aware.

## 2016-11-07 NOTE — Progress Notes (Signed)
She has apparently elected palliative care. I will plan to sign off. Thanks for allowing me to see her with you

## 2016-11-07 NOTE — Progress Notes (Signed)
Pt sitting on the sid of the bed. Pt wanted some ice to drink her Sprint drink. Call light within reach. Nothing needed at this time.

## 2016-11-08 DIAGNOSIS — F411 Generalized anxiety disorder: Secondary | ICD-10-CM

## 2016-11-08 MED ORDER — ALBUTEROL SULFATE (2.5 MG/3ML) 0.083% IN NEBU
2.5000 mg | INHALATION_SOLUTION | Freq: Two times a day (BID) | RESPIRATORY_TRACT | Status: DC
  Administered 2016-11-08: 2.5 mg via RESPIRATORY_TRACT
  Filled 2016-11-08: qty 3

## 2016-11-08 NOTE — Progress Notes (Signed)
Physical Therapy Treatment Patient Details Name: Cheyenne Case MRN: [id number] DOB: Oct 04, 1940 Today's Date: 11/08/2016    History of Present Illness 76 yo F admitted due to dyspnea over 1 week superimposed on a background of about 1 year progressive worsening of function. She lives with her brother for the past 7 years and over the past 1 year has not been able to walk more than 10 steps without being short of breath or giving out  Dx: Acute exacerbation of COPD superimposed on severe emphysema    PT Comments    Pt received in bed, niece present, and brother stepped out of the room during PT tx.  Pt expressed that she feels weak, and SOB during tx.  She demonstrated ability to transfer supine<>sit, and sit<>stand at Mod (I).  She ambulated a total of 74f with rollator walker at Mod (I)/supervision level, and 2 seated rest breaks.  Her SpO2 ranged from 97-100% on 3L during ambulation.  Her HR ranged from 59-123bpm.  Continue to recommend HHPT at this time.  She did express concern that her brother cannot care for her at home, and when PT asked her to elaborate on why not, she stated that he cannot help her with bathing.  Therefore[facility], as well as [facility] to assist with improvement with ADL's.  Niece expressed concern as to how long the assistance at home would last, and what would they do if she does not improve.  Therefore, also discussed that PT could ask about the patient having a home health social worker as well.     Follow Up Recommendations  Home health PT ([facility], [facility] )     Equipment Recommendations  Other (comment);Wheelchair (measurements PT);Wheelchair cushion (measurements PT) (Rollator walker ([phone] - has already been delivered to the room. )    Recommendations for Other Services       Precautions / Restrictions Precautions Precautions: Fall Precaution Comments: 1 - squatted down to pick up something, and got a cramp in the L LE,  and she sat down on the floor.   Restrictions Weight Bearing Restrictions: No    Mobility  Bed Mobility Overal bed mobility: Modified Independent                Transfers Overall transfer level: Modified independent Equipment used: 4-wheeled walker Transfers: Sit to/from [facility] to Stand: Modified independent (Device/Increase time) Stand pivot transfers: Supervision;Modified independent (Device/Increase time) (supervision required as pt was using rollator walker to effectively lock brakes. )          Ambulation/Gait Ambulation/Gait assistance: Supervision;Modified independent (Device/Increase time) Ambulation Distance (Feet): 55 Feet Assistive device: 4-wheeled walker Gait Pattern/deviations: Trunk flexed;Decreased step length - right;Decreased step length - left     General Gait Details: Pt required 2 seated rest breaks during ambulation.  Pt's SpO2 remained 97-100% during ambulation on 3L, however, HR varied from 59bpm - 123bpm.  Pt expressed feeling like she could not catch her breath.    Stairs            Wheelchair Mobility    Modified Rankin (Stroke Patients Only)       Balance Overall balance assessment: History of Falls;Needs assistance Sitting-balance support: Bilateral upper extremity supported;Feet supported Sitting balance-Leahy Scale: Good     Standing balance support: Bilateral upper extremity supported Standing balance-Leahy Scale: Good  Cognition Arousal/Alertness: Awake/alert Behavior During Therapy: WFL for tasks assessed/performed Overall Cognitive Status: Within Functional Limits for tasks assessed                      Exercises      General Comments        Pertinent Vitals/Pain Pain Assessment: No/denies pain    Home Living   Living Arrangements: Other relatives (brother) Available Help at Discharge: Available 24 hours/day Type of Home: House Home Access:  Stairs to enter   Home Layout: One level Home Equipment: None      Prior Function            PT Goals (current goals can now be found in the care plan section) Progress towards PT goals: Goals met/education completed, patient discharged from PT    Frequency           PT Plan      Co-evaluation             End of Session Equipment Utilized During Treatment: Gait belt;Oxygen (Rollator walker) Activity Tolerance: Patient limited by fatigue Patient left: in chair;with call bell/phone within reach;with family/visitor present     Time: 8457-3344 PT Time Calculation (min) (ACUTE ONLY): 29 min  Charges:  $Gait Training: 8-22 mins $Therapeutic Activity: 8-22 mins                    G Codes:      Beth Nadiah Corbit, PT, DPT X: [phone]

## 2016-11-08 NOTE — Progress Notes (Signed)
IV removed, WNL. D/C instructions given to pt. Verbalized understanding. Pt has niece and other family members to take her home.

## 2016-11-08 NOTE — Discharge Summary (Signed)
Physician Discharge Summary  Cheyenne Case FSF:[id number] DOB: 04-18-1941 DOA: 11/03/2016  PCP: Leonides Grills, MD  Admit date: 11/03/2016 Discharge date: 11/08/2016  Admitted From: Home Disposition:  Home   Recommendations for Outpatient Follow-up:  1. Follow up with PCP in 1-2 weeks (please call Dr. Kathaleen Grinder office on Monday to schedule follow up) 2. Please obtain BMP/CBC in one week 3. Take medications as prescribed  Home Health: Yes- PT/RN/Aide/OT/SW Equipment/Devices: Rollator walker, wheelchair, portable oxygen  Discharge Condition: Stable  CODE STATUS: DNR Diet recommendation: Heart Healthy   Brief/Interim Summary: 75 ? COPD, Pulm Htn, H/o steroid induced hypoglycemia, ckd III.  Dyspnea over 1 weeksuperimposed on a background of about 1 year progressive worsening of function. She lives with her brother for the past 7 years and over the past 1 year has not been able to walk more than 10 steps without being short of breath or giving out. She does not use home oxygen. Treated recently by Dr. Hilma Favors office with antibiotics and steroids early January. No one was ill around her or had the flu or any other viral illnesses. Does not think she ever really recovered from the same. No diarrhea, no chest pain, no myalgias  Workup in the emergency room showed BUN/creatinine 35/1.3, WBC 8.7 hemoglobin 13.3, CT and cxr concerning 2.2 x 1.7 cm LUL--superimposed on severe emphysema.  Pulmonology consulted for evaluation for possible bronchoscopy.  However, due to location of lesion bronchoscopy would not obtain samples.  IR was consulted for possible biopsy but suggested patient undergo PET scan after discharge and talk to PCP about possibly referring to thoracic oncology without biopsy for treatment.  Patient declined this option stating she does not wish to get the PET scan or follow up with anyone regarding possibility of cancer.  Palliative Care was consulted for assistance with patient  establishing goals of cancer and discussing long term options.  Patient was re- evaluated by PT on 11/08/16 to see if she was appropriate for SNF and again was found to be appropriate for home health.  She was set up with [facility] as well as oxygen, rollator and a wheelchair was ordered prior to her discharge.    Discharge Diagnoses:  Active Problems:   COPD (chronic obstructive pulmonary disease) (HCC)   COPD exacerbation (HCC)   Lung cancer (HCC)   Palliative care encounter   Goals of care, counseling/discussion   DNR (do not resuscitate) discussion   Muscle weakness (generalized)    Discharge Instructions  Discharge Instructions    Ambulatory referral to Pulmonology    Complete by:  As directed    Call MD for:  difficulty breathing, headache or visual disturbances    Complete by:  As directed    Call MD for:  extreme fatigue    Complete by:  As directed    Call MD for:  hives    Complete by:  As directed    Call MD for:  persistant dizziness or light-headedness    Complete by:  As directed    Call MD for:  persistant nausea and vomiting    Complete by:  As directed    Call MD for:  severe uncontrolled pain    Complete by:  As directed    Call MD for:  temperature >100.4    Complete by:  As directed    Diet - low sodium heart healthy    Complete by:  As directed    Increase activity slowly    Complete by:  As directed      Allergies as of 11/08/2016   No Known Allergies     Medication List    STOP taking these medications   Vitamin D (Ergocalciferol) 50000 units Caps capsule Commonly known as:  DRISDOL     TAKE these medications   albuterol (2.5 MG/3ML) 0.083% nebulizer solution Commonly known as:  PROVENTIL Take 2.5 mg by nebulization every 4 (four) hours as needed. For shortness of breath   budesonide-formoterol 160-4.5 MCG/ACT inhaler Commonly known as:  SYMBICORT Inhale 2 puffs into the lungs 2 (two) times daily.   furosemide 20 MG tablet Commonly known as:   LASIX Take 20 mg by mouth 2 (two) times daily.   lisinopril 5 MG tablet Commonly known as:  PRINIVIL,ZESTRIL Take 1 tablet (5 mg total) by mouth daily.   SPIRIVA HANDIHALER 18 MCG inhalation capsule Generic drug:  tiotropium Take 1 capsule by mouth daily at 2 PM.            Durable Medical Equipment        Start     Ordered   11/08/16 1344  For home use only DME lightweight manual wheelchair with seat cushion  (Wheelchairs)  Once    Comments:  Patient suffers from COPD which impairs their ability to perform daily activities like dressing and toileting in the home.  A walker will not resolve  issue with performing activities of daily living. A wheelchair will allow patient to safely perform daily activities. Patient is not able to propel themselves in the home using a standard weight wheelchair due to endurance. Patient can self propel in the lightweight wheelchair.  Accessories: elevating leg rests (ELRs), wheel locks, extensions and anti-tippers.   11/08/16 1344   11/08/16 1119  For home use only DME Nebulizer machine  Once    Question:  Patient needs a nebulizer to treat with the following condition  Answer:  COPD (chronic obstructive pulmonary disease) ([place])   11/08/16 1118   11/06/16 1546  For home use only DME oxygen  Once    Question Answer Comment  Mode or (Route) Nasal cannula   Liters per Minute 2   Frequency Continuous (stationary and portable oxygen unit needed)   Oxygen conserving device Yes   Oxygen delivery system Gas      11/06/16 1545   11/06/16 1343  For home use only DME oxygen  Once    Question Answer Comment  Mode or (Route) Nasal cannula   Liters per Minute 3   Frequency Continuous (stationary and portable oxygen unit needed)   Oxygen conserving device Yes   Oxygen delivery system Gas      11/06/16 1342   11/05/16 1505  For home use only DME 4 wheeled rolling walker with seat  Once    Question:  Patient needs a walker to treat with the following  condition  Answer:  COPD (chronic obstructive pulmonary disease) ([place])   11/05/16 1504     Follow-up Information    HAWKINS,EDWARD L, MD. Schedule an appointment as soon as possible for a visit in 1 week(s).   Specialty:  Pulmonary Disease Contact information: [place] [address] [phone]          No Known Allergies  Consultations:  PT  CM  Pulmonology   Procedures/Studies: Dg Chest 2 View  Result Date: 11/03/2016 CLINICAL DATA:  Chronic shortness of breath with increased symptoms since yesterday. History of COPD, pulmonary hypertension, former smoker. EXAM:  CHEST  2 VIEW COMPARISON:  Portable chest x-ray of November 23, 2011 FINDINGS: The lungs are hyperinflated. There is an abnormal approximately 2 x 2.5 cm soft tissue density in the inferior -lateral aspect of the left upper lobe. There is a coarse popcorn like calcification in the anterior aspect of the lingula. Elsewhere the lung parenchyma is clear. The heart is normal in size. The pulmonary vascularity is not engorged. There is calcification in the wall of the aortic arch. There is a hiatal hernia. The bony thorax exhibits no acute abnormality. IMPRESSION: COPD. New abnormal masslike density inferior laterally in the left upper lobe worrisome for malignancy. Its appearance is atypical for pneumonia. Chest CT scanning now is recommended. Thoracic aortic atherosclerosis. Electronically Signed   By: David  Martinique M.D.   On: 11/03/2016 08:42   Ct Chest W Contrast  Result Date: 11/03/2016 CLINICAL DATA:  Shortness of breath for 2 days.  COPD. EXAM: CT CHEST WITH CONTRAST TECHNIQUE: Multidetector CT imaging of the chest was performed during intravenous contrast administration. CONTRAST:  75m ISOVUE-300 IOPAMIDOL (ISOVUE-300) INJECTION 61% COMPARISON:  None. FINDINGS: Cardiovascular: Normal heart size. No pericardial effusion. Coronary artery atherosclerosis in the left main, lad, circumflex and  RCA. Normal caliber thoracic aorta with atherosclerosis. Mediastinum/Nodes: No enlarged mediastinal, hilar, or axillary lymph nodes. Thyroid gland, trachea, and esophagus demonstrate no significant findings. Lungs/Pleura: Severe bilateral centrilobular and paraseptal emphysema. 2.2 x 1.7 cm pulmonary nodule in the posterior segment of the left upper lobe most concerning for primary lung malignancy. No other pulmonary nodule or pulmonary mass. Upper Abdomen: Moderate size hiatal hernia. 2.6 cm hypodense left hepatic mass with peripheral nodular enhancement most consistent with a hemangioma. 2.2 cm hypodense right hepatic mass with peripheral nodular enhancement most consistent with a hemangioma. No acute upper abdominal abnormality. Fat containing left para-aortic hernia. Musculoskeletal: No acute osseous abnormality. No lytic or sclerotic osseous lesion. 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 IMPRESSION: 1. 2.2 x 1.7 cm pulmonary nodule in the posterior segment of the left upper lobe most concerning for primary lung malignancy. Oncology consultation is recommended. 2. Severe bilateral emphysema. 3.  Aortic Atherosclerosis (ICD10-170.0) Electronically Signed   By: HKathreen Devoid  On: 11/03/2016 11:44   Ct Abdomen Pelvis W Contrast  Result Date: 11/04/2016 CLINICAL DATA:  Lung cancer.  Shortness of breath.  Hiatal hernia. EXAM: CT ABDOMEN AND PELVIS WITH CONTRAST TECHNIQUE: Multidetector CT imaging of the abdomen and pelvis was performed using the standard protocol following bolus administration of intravenous contrast. CONTRAST:  711mISOVUE-300 IOPAMIDOL (ISOVUE-300) INJECTION 61% COMPARISON:  CT abdomen pelvis 05/24/2009 FINDINGS: Lower chest: There is emphysema and bibasilar atelectasis. No visible pericardial effusion. There is a moderate-sized hiatal hernia. 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 The largest is at the anterior left hepatic lobe measuring  up to 4 cm. No new hepatic lesions. Normal gallbladder. Pancreas: Normal pancreatic contours and enhancement. No peripancreatic fluid collection or pancreatic ductal dilatation. Spleen: Normal. Adrenals/Urinary Tract: Normal adrenal glands. Unchanged large lower pole left renal cyst measuring 5.4 cm. No hydronephrosis or solid renal mass. Stomach/Bowel: No abnormal bowel dilatation. No bowel wall thickening or adjacent fat stranding to indicate acute inflammation. No abdominal fluid collection. Normal appendix. Vascular/Lymphatic: There is atherosclerotic calcification of the non aneurysmal abdominal aorta. Small partially calcified left para-aortic mass is unchanged in size measuring 1.5 by 0.6 cm Reproductive: Normal uterus. No adnexal mass. No free fluid in the pelvis. Musculoskeletal: No lytic or blastic  osseous lesion. Normal visualized extrathoracic and extraperitoneal soft tissues. Other: No contributory non-categorized findings. IMPRESSION: 1. No acute abnormality of the abdomen or pelvis. 2. Unchanged appearance of multiple hepatic hemangiomas. 3. No evidence of metastatic disease to the abdomen or pelvis. Electronically Signed   By: Ulyses Jarred M.D.   On: 11/04/2016 15:11      Subjective: Patient reports she is anxious to be seen again by PT.  After being seen by PT she voices that she is concerned that her heart was racing when she was working with them (heart rate during ambulation would go up to 123).  She refused another EKG (one done two days prior when patient had similar concerns showing sinus tachycardia).    Discharge Exam: Vitals:   11/08/16 0539 11/08/16 1103  BP: (!) 144/73   Pulse: 99 (!) 123  Resp: 18   Temp: 98.4 F (36.9 C)    Vitals:   11/07/16 2147 11/08/16 0334 11/08/16 0539 11/08/16 1103  BP: (!) 132/55  (!) 144/73   Pulse: 87  99 (!) 123  Resp: 16  18   Temp: 97.9 F (36.6 C)  98.4 F (36.9 C)   TempSrc: Oral  Oral   SpO2: 100% 96% 100% 98%  Weight:       Height:        General: Pt is alert, awake, not in acute distress Cardiovascular: RRR, S1/S2 +, no rubs, no gallops Respiratory: some scattered wheezing but air movement in all lung fields Abdominal: Soft, NT, ND, bowel sounds + Extremities: no edema, no cyanosis    The results of significant diagnostics from this hospitalization (including imaging, microbiology, ancillary and laboratory) are listed below for reference.     Microbiology: No results found for this or any previous visit (from the past 240 hour(s)).   Labs: BNP (last 3 results)  Recent Labs  11/03/16 0746  BNP 61.6   Basic Metabolic Panel:  Recent Labs Lab 11/03/16 0746 11/04/16 0440 11/06/16 0549 11/07/16 1316  NA 137 137 138 139  K 3.6 3.9 4.7 3.6  CL 99* 100* 109 104  CO2 _0 GLUCOSE 182* 142* 98 150*  BUN 35* 34* 26* 23*  CREATININE 1.36* 1.38* 1.18* 1.26*  CALCIUM 9.0 8.5* 8.3* 8.7*   Liver Function Tests:  Recent Labs Lab 11/04/16 0440 11/07/16 1316  AST 22 40  ALT 15 32  ALKPHOS 68 59  BILITOT 1.2 0.8  PROT 6.0* 6.2*  ALBUMIN 3.2* 3.3*   No results for input(s): LIPASE, AMYLASE in the last 168 hours. No results for input(s): AMMONIA in the last 168 hours. CBC:  Recent Labs Lab 11/03/16 0746 11/04/16 0440 11/07/16 1316  WBC 8.7 10.3 10.4  NEUTROABS 6.6  --  7.4  HGB 13.3 11.8* 12.3  HCT 40.8 35.8* 38.2  MCV 99.0 98.4 99.2  PLT 477* 390 374   Cardiac Enzymes:  Recent Labs Lab 11/03/16 0746  TROPONINI <0.03   BNP: Invalid input(s): POCBNP CBG:  Recent Labs Lab 11/07/16 1607 11/07/16 2152  GLUCAP 56* 91   D-Dimer No results for input(s): DDIMER in the last 72 hours. Hgb A1c No results for input(s): HGBA1C in the last 72 hours. Lipid Profile No results for input(s): CHOL, HDL, LDLCALC, TRIG, CHOLHDL, LDLDIRECT in the last 72 hours. Thyroid function studies No results for input(s): TSH, T4TOTAL, T3FREE, THYROIDAB in the last 72 hours.  Invalid  input(s): FREET3 Anemia work up No results for input(s): VITAMINB12, FOLATE, FERRITIN,  TIBC, IRON, RETICCTPCT in the last 72 hours. Urinalysis    Component Value Date/Time   COLORURINE YELLOW 05/24/2009 0309   APPEARANCEUR CLEAR 05/24/2009 0309   LABSPEC >1.030 (H) 05/24/2009 0309   PHURINE 5.5 05/24/2009 0309   GLUCOSEU NEGATIVE 05/24/2009 0309   HGBUR TRACE (A) 05/24/2009 0309   BILIRUBINUR NEGATIVE 05/24/2009 0309   KETONESUR NEGATIVE 05/24/2009 0309   PROTEINUR NEGATIVE 05/24/2009 0309   UROBILINOGEN 0.2 05/24/2009 0309   NITRITE NEGATIVE 05/24/2009 0309   LEUKOCYTESUR NEGATIVE 05/24/2009 0309   Sepsis Labs Invalid input(s): PROCALCITONIN,  WBC,  LACTICIDVEN Microbiology No results found for this or any previous visit (from the past 240 hour(s)).   Time coordinating discharge: 35 minutes  SIGNED:   Loretha Stapler, MD  [facility] 11/08/2016, 1:51 PM Pager [phone] If 7PM-7AM, please contact night-coverage [email or web address] Password [email or web address]

## 2016-11-09 DIAGNOSIS — E559 Vitamin D deficiency, unspecified: Secondary | ICD-10-CM | POA: Diagnosis not present

## 2016-11-09 DIAGNOSIS — I129 Hypertensive chronic kidney disease with stage 1 through stage 4 chronic kidney disease, or unspecified chronic kidney disease: Secondary | ICD-10-CM | POA: Diagnosis not present

## 2016-11-09 DIAGNOSIS — Z9981 Dependence on supplemental oxygen: Secondary | ICD-10-CM | POA: Diagnosis not present

## 2016-11-09 DIAGNOSIS — J441 Chronic obstructive pulmonary disease with (acute) exacerbation: Secondary | ICD-10-CM | POA: Diagnosis not present

## 2016-11-09 DIAGNOSIS — Z5181 Encounter for therapeutic drug level monitoring: Secondary | ICD-10-CM | POA: Diagnosis not present

## 2016-11-09 DIAGNOSIS — N183 Chronic kidney disease, stage 3 (moderate): Secondary | ICD-10-CM | POA: Diagnosis not present

## 2016-11-09 DIAGNOSIS — M6281 Muscle weakness (generalized): Secondary | ICD-10-CM | POA: Diagnosis not present

## 2016-11-09 DIAGNOSIS — C3490 Malignant neoplasm of unspecified part of unspecified bronchus or lung: Secondary | ICD-10-CM | POA: Diagnosis not present

## 2016-11-10 DIAGNOSIS — N183 Chronic kidney disease, stage 3 (moderate): Secondary | ICD-10-CM | POA: Diagnosis not present

## 2016-11-10 DIAGNOSIS — I129 Hypertensive chronic kidney disease with stage 1 through stage 4 chronic kidney disease, or unspecified chronic kidney disease: Secondary | ICD-10-CM | POA: Diagnosis not present

## 2016-11-10 DIAGNOSIS — C3490 Malignant neoplasm of unspecified part of unspecified bronchus or lung: Secondary | ICD-10-CM | POA: Diagnosis not present

## 2016-11-10 DIAGNOSIS — E559 Vitamin D deficiency, unspecified: Secondary | ICD-10-CM | POA: Diagnosis not present

## 2016-11-10 DIAGNOSIS — M6281 Muscle weakness (generalized): Secondary | ICD-10-CM | POA: Diagnosis not present

## 2016-11-10 DIAGNOSIS — J441 Chronic obstructive pulmonary disease with (acute) exacerbation: Secondary | ICD-10-CM | POA: Diagnosis not present

## 2016-11-12 DIAGNOSIS — E559 Vitamin D deficiency, unspecified: Secondary | ICD-10-CM | POA: Diagnosis not present

## 2016-11-12 DIAGNOSIS — J441 Chronic obstructive pulmonary disease with (acute) exacerbation: Secondary | ICD-10-CM | POA: Diagnosis not present

## 2016-11-12 DIAGNOSIS — C3490 Malignant neoplasm of unspecified part of unspecified bronchus or lung: Secondary | ICD-10-CM | POA: Diagnosis not present

## 2016-11-12 DIAGNOSIS — M6281 Muscle weakness (generalized): Secondary | ICD-10-CM | POA: Diagnosis not present

## 2016-11-12 DIAGNOSIS — N183 Chronic kidney disease, stage 3 (moderate): Secondary | ICD-10-CM | POA: Diagnosis not present

## 2016-11-12 DIAGNOSIS — I129 Hypertensive chronic kidney disease with stage 1 through stage 4 chronic kidney disease, or unspecified chronic kidney disease: Secondary | ICD-10-CM | POA: Diagnosis not present

## 2016-11-16 DIAGNOSIS — M6281 Muscle weakness (generalized): Secondary | ICD-10-CM | POA: Diagnosis not present

## 2016-11-16 DIAGNOSIS — J441 Chronic obstructive pulmonary disease with (acute) exacerbation: Secondary | ICD-10-CM | POA: Diagnosis not present

## 2016-11-16 DIAGNOSIS — I129 Hypertensive chronic kidney disease with stage 1 through stage 4 chronic kidney disease, or unspecified chronic kidney disease: Secondary | ICD-10-CM | POA: Diagnosis not present

## 2016-11-16 DIAGNOSIS — C3490 Malignant neoplasm of unspecified part of unspecified bronchus or lung: Secondary | ICD-10-CM | POA: Diagnosis not present

## 2016-11-16 DIAGNOSIS — N183 Chronic kidney disease, stage 3 (moderate): Secondary | ICD-10-CM | POA: Diagnosis not present

## 2016-11-16 DIAGNOSIS — E559 Vitamin D deficiency, unspecified: Secondary | ICD-10-CM | POA: Diagnosis not present

## 2016-11-17 DIAGNOSIS — N183 Chronic kidney disease, stage 3 (moderate): Secondary | ICD-10-CM | POA: Diagnosis not present

## 2016-11-17 DIAGNOSIS — M6281 Muscle weakness (generalized): Secondary | ICD-10-CM | POA: Diagnosis not present

## 2016-11-17 DIAGNOSIS — C3490 Malignant neoplasm of unspecified part of unspecified bronchus or lung: Secondary | ICD-10-CM | POA: Diagnosis not present

## 2016-11-17 DIAGNOSIS — E559 Vitamin D deficiency, unspecified: Secondary | ICD-10-CM | POA: Diagnosis not present

## 2016-11-17 DIAGNOSIS — J441 Chronic obstructive pulmonary disease with (acute) exacerbation: Secondary | ICD-10-CM | POA: Diagnosis not present

## 2016-11-17 DIAGNOSIS — I129 Hypertensive chronic kidney disease with stage 1 through stage 4 chronic kidney disease, or unspecified chronic kidney disease: Secondary | ICD-10-CM | POA: Diagnosis not present

## 2016-11-19 DIAGNOSIS — N183 Chronic kidney disease, stage 3 (moderate): Secondary | ICD-10-CM | POA: Diagnosis not present

## 2016-11-19 DIAGNOSIS — J9611 Chronic respiratory failure with hypoxia: Secondary | ICD-10-CM | POA: Diagnosis not present

## 2016-11-19 DIAGNOSIS — I129 Hypertensive chronic kidney disease with stage 1 through stage 4 chronic kidney disease, or unspecified chronic kidney disease: Secondary | ICD-10-CM | POA: Diagnosis not present

## 2016-11-19 DIAGNOSIS — I509 Heart failure, unspecified: Secondary | ICD-10-CM | POA: Diagnosis not present

## 2016-11-19 DIAGNOSIS — E559 Vitamin D deficiency, unspecified: Secondary | ICD-10-CM | POA: Diagnosis not present

## 2016-11-19 DIAGNOSIS — J449 Chronic obstructive pulmonary disease, unspecified: Secondary | ICD-10-CM | POA: Diagnosis not present

## 2016-11-19 DIAGNOSIS — C3490 Malignant neoplasm of unspecified part of unspecified bronchus or lung: Secondary | ICD-10-CM | POA: Diagnosis not present

## 2016-11-19 DIAGNOSIS — M6281 Muscle weakness (generalized): Secondary | ICD-10-CM | POA: Diagnosis not present

## 2016-11-19 DIAGNOSIS — E46 Unspecified protein-calorie malnutrition: Secondary | ICD-10-CM | POA: Diagnosis not present

## 2016-11-19 DIAGNOSIS — J441 Chronic obstructive pulmonary disease with (acute) exacerbation: Secondary | ICD-10-CM | POA: Diagnosis not present

## 2016-11-23 DIAGNOSIS — I129 Hypertensive chronic kidney disease with stage 1 through stage 4 chronic kidney disease, or unspecified chronic kidney disease: Secondary | ICD-10-CM | POA: Diagnosis not present

## 2016-11-23 DIAGNOSIS — M6281 Muscle weakness (generalized): Secondary | ICD-10-CM | POA: Diagnosis not present

## 2016-11-23 DIAGNOSIS — C3490 Malignant neoplasm of unspecified part of unspecified bronchus or lung: Secondary | ICD-10-CM | POA: Diagnosis not present

## 2016-11-23 DIAGNOSIS — J441 Chronic obstructive pulmonary disease with (acute) exacerbation: Secondary | ICD-10-CM | POA: Diagnosis not present

## 2016-11-23 DIAGNOSIS — N183 Chronic kidney disease, stage 3 (moderate): Secondary | ICD-10-CM | POA: Diagnosis not present

## 2016-11-23 DIAGNOSIS — E559 Vitamin D deficiency, unspecified: Secondary | ICD-10-CM | POA: Diagnosis not present

## 2016-11-24 DIAGNOSIS — C3490 Malignant neoplasm of unspecified part of unspecified bronchus or lung: Secondary | ICD-10-CM | POA: Diagnosis not present

## 2016-11-24 DIAGNOSIS — E559 Vitamin D deficiency, unspecified: Secondary | ICD-10-CM | POA: Diagnosis not present

## 2016-11-24 DIAGNOSIS — N183 Chronic kidney disease, stage 3 (moderate): Secondary | ICD-10-CM | POA: Diagnosis not present

## 2016-11-24 DIAGNOSIS — M6281 Muscle weakness (generalized): Secondary | ICD-10-CM | POA: Diagnosis not present

## 2016-11-24 DIAGNOSIS — J441 Chronic obstructive pulmonary disease with (acute) exacerbation: Secondary | ICD-10-CM | POA: Diagnosis not present

## 2016-11-24 DIAGNOSIS — I129 Hypertensive chronic kidney disease with stage 1 through stage 4 chronic kidney disease, or unspecified chronic kidney disease: Secondary | ICD-10-CM | POA: Diagnosis not present

## 2016-11-25 DIAGNOSIS — N183 Chronic kidney disease, stage 3 (moderate): Secondary | ICD-10-CM | POA: Diagnosis not present

## 2016-11-25 DIAGNOSIS — I129 Hypertensive chronic kidney disease with stage 1 through stage 4 chronic kidney disease, or unspecified chronic kidney disease: Secondary | ICD-10-CM | POA: Diagnosis not present

## 2016-11-25 DIAGNOSIS — C3490 Malignant neoplasm of unspecified part of unspecified bronchus or lung: Secondary | ICD-10-CM | POA: Diagnosis not present

## 2016-11-25 DIAGNOSIS — E559 Vitamin D deficiency, unspecified: Secondary | ICD-10-CM | POA: Diagnosis not present

## 2016-11-25 DIAGNOSIS — M6281 Muscle weakness (generalized): Secondary | ICD-10-CM | POA: Diagnosis not present

## 2016-11-25 DIAGNOSIS — J441 Chronic obstructive pulmonary disease with (acute) exacerbation: Secondary | ICD-10-CM | POA: Diagnosis not present

## 2016-11-26 DIAGNOSIS — I129 Hypertensive chronic kidney disease with stage 1 through stage 4 chronic kidney disease, or unspecified chronic kidney disease: Secondary | ICD-10-CM | POA: Diagnosis not present

## 2016-11-26 DIAGNOSIS — M6281 Muscle weakness (generalized): Secondary | ICD-10-CM | POA: Diagnosis not present

## 2016-11-26 DIAGNOSIS — J441 Chronic obstructive pulmonary disease with (acute) exacerbation: Secondary | ICD-10-CM | POA: Diagnosis not present

## 2016-11-26 DIAGNOSIS — C3490 Malignant neoplasm of unspecified part of unspecified bronchus or lung: Secondary | ICD-10-CM | POA: Diagnosis not present

## 2016-11-26 DIAGNOSIS — N183 Chronic kidney disease, stage 3 (moderate): Secondary | ICD-10-CM | POA: Diagnosis not present

## 2016-11-26 DIAGNOSIS — E559 Vitamin D deficiency, unspecified: Secondary | ICD-10-CM | POA: Diagnosis not present

## 2016-11-30 DIAGNOSIS — M6281 Muscle weakness (generalized): Secondary | ICD-10-CM | POA: Diagnosis not present

## 2016-11-30 DIAGNOSIS — N183 Chronic kidney disease, stage 3 (moderate): Secondary | ICD-10-CM | POA: Diagnosis not present

## 2016-11-30 DIAGNOSIS — E559 Vitamin D deficiency, unspecified: Secondary | ICD-10-CM | POA: Diagnosis not present

## 2016-11-30 DIAGNOSIS — C3490 Malignant neoplasm of unspecified part of unspecified bronchus or lung: Secondary | ICD-10-CM | POA: Diagnosis not present

## 2016-11-30 DIAGNOSIS — I129 Hypertensive chronic kidney disease with stage 1 through stage 4 chronic kidney disease, or unspecified chronic kidney disease: Secondary | ICD-10-CM | POA: Diagnosis not present

## 2016-11-30 DIAGNOSIS — J441 Chronic obstructive pulmonary disease with (acute) exacerbation: Secondary | ICD-10-CM | POA: Diagnosis not present

## 2016-12-04 DIAGNOSIS — J441 Chronic obstructive pulmonary disease with (acute) exacerbation: Secondary | ICD-10-CM | POA: Diagnosis not present

## 2016-12-04 DIAGNOSIS — C3490 Malignant neoplasm of unspecified part of unspecified bronchus or lung: Secondary | ICD-10-CM | POA: Diagnosis not present

## 2016-12-04 DIAGNOSIS — M6281 Muscle weakness (generalized): Secondary | ICD-10-CM | POA: Diagnosis not present

## 2016-12-04 DIAGNOSIS — E559 Vitamin D deficiency, unspecified: Secondary | ICD-10-CM | POA: Diagnosis not present

## 2016-12-04 DIAGNOSIS — I129 Hypertensive chronic kidney disease with stage 1 through stage 4 chronic kidney disease, or unspecified chronic kidney disease: Secondary | ICD-10-CM | POA: Diagnosis not present

## 2016-12-04 DIAGNOSIS — N183 Chronic kidney disease, stage 3 (moderate): Secondary | ICD-10-CM | POA: Diagnosis not present

## 2016-12-09 DIAGNOSIS — E559 Vitamin D deficiency, unspecified: Secondary | ICD-10-CM | POA: Diagnosis not present

## 2016-12-09 DIAGNOSIS — I129 Hypertensive chronic kidney disease with stage 1 through stage 4 chronic kidney disease, or unspecified chronic kidney disease: Secondary | ICD-10-CM | POA: Diagnosis not present

## 2016-12-09 DIAGNOSIS — N183 Chronic kidney disease, stage 3 (moderate): Secondary | ICD-10-CM | POA: Diagnosis not present

## 2016-12-09 DIAGNOSIS — J441 Chronic obstructive pulmonary disease with (acute) exacerbation: Secondary | ICD-10-CM | POA: Diagnosis not present

## 2016-12-09 DIAGNOSIS — M6281 Muscle weakness (generalized): Secondary | ICD-10-CM | POA: Diagnosis not present

## 2016-12-09 DIAGNOSIS — C3490 Malignant neoplasm of unspecified part of unspecified bronchus or lung: Secondary | ICD-10-CM | POA: Diagnosis not present

## 2016-12-21 DIAGNOSIS — N183 Chronic kidney disease, stage 3 (moderate): Secondary | ICD-10-CM | POA: Diagnosis not present

## 2016-12-21 DIAGNOSIS — J441 Chronic obstructive pulmonary disease with (acute) exacerbation: Secondary | ICD-10-CM | POA: Diagnosis not present

## 2016-12-21 DIAGNOSIS — M6281 Muscle weakness (generalized): Secondary | ICD-10-CM | POA: Diagnosis not present

## 2016-12-21 DIAGNOSIS — E559 Vitamin D deficiency, unspecified: Secondary | ICD-10-CM | POA: Diagnosis not present

## 2016-12-21 DIAGNOSIS — I129 Hypertensive chronic kidney disease with stage 1 through stage 4 chronic kidney disease, or unspecified chronic kidney disease: Secondary | ICD-10-CM | POA: Diagnosis not present

## 2016-12-21 DIAGNOSIS — C3490 Malignant neoplasm of unspecified part of unspecified bronchus or lung: Secondary | ICD-10-CM | POA: Diagnosis not present

## 2017-01-06 DIAGNOSIS — N183 Chronic kidney disease, stage 3 (moderate): Secondary | ICD-10-CM | POA: Diagnosis not present

## 2017-01-06 DIAGNOSIS — C3490 Malignant neoplasm of unspecified part of unspecified bronchus or lung: Secondary | ICD-10-CM | POA: Diagnosis not present

## 2017-01-06 DIAGNOSIS — J441 Chronic obstructive pulmonary disease with (acute) exacerbation: Secondary | ICD-10-CM | POA: Diagnosis not present

## 2017-01-06 DIAGNOSIS — E559 Vitamin D deficiency, unspecified: Secondary | ICD-10-CM | POA: Diagnosis not present

## 2017-01-06 DIAGNOSIS — M6281 Muscle weakness (generalized): Secondary | ICD-10-CM | POA: Diagnosis not present

## 2017-01-06 DIAGNOSIS — I129 Hypertensive chronic kidney disease with stage 1 through stage 4 chronic kidney disease, or unspecified chronic kidney disease: Secondary | ICD-10-CM | POA: Diagnosis not present

## 2017-01-19 DIAGNOSIS — J449 Chronic obstructive pulmonary disease, unspecified: Secondary | ICD-10-CM | POA: Diagnosis not present

## 2017-01-19 DIAGNOSIS — J9611 Chronic respiratory failure with hypoxia: Secondary | ICD-10-CM | POA: Diagnosis not present

## 2017-01-19 DIAGNOSIS — N19 Unspecified kidney failure: Secondary | ICD-10-CM | POA: Diagnosis not present

## 2017-01-19 DIAGNOSIS — I5030 Unspecified diastolic (congestive) heart failure: Secondary | ICD-10-CM | POA: Diagnosis not present

## 2017-03-23 DIAGNOSIS — Z1389 Encounter for screening for other disorder: Secondary | ICD-10-CM | POA: Diagnosis not present

## 2017-03-23 DIAGNOSIS — Z0000 Encounter for general adult medical examination without abnormal findings: Secondary | ICD-10-CM | POA: Diagnosis not present

## 2017-03-23 DIAGNOSIS — Z6820 Body mass index (BMI) 20.0-20.9, adult: Secondary | ICD-10-CM | POA: Diagnosis not present

## 2017-04-06 DIAGNOSIS — J9611 Chronic respiratory failure with hypoxia: Secondary | ICD-10-CM | POA: Diagnosis not present

## 2017-04-06 DIAGNOSIS — I5032 Chronic diastolic (congestive) heart failure: Secondary | ICD-10-CM | POA: Diagnosis not present

## 2017-04-06 DIAGNOSIS — I10 Essential (primary) hypertension: Secondary | ICD-10-CM | POA: Diagnosis not present

## 2017-04-06 DIAGNOSIS — J449 Chronic obstructive pulmonary disease, unspecified: Secondary | ICD-10-CM | POA: Diagnosis not present

## 2017-05-28 IMAGING — CT CT CHEST W/ CM
2 of 4 series · 15 of 36 positions shown, 18 images · IV contrast (iopamidol)
Comparison: None.

CLINICAL DATA: Shortness of breath for 2 days.  COPD.

EXAM:
CT CHEST WITH CONTRAST
TECHNIQUE: Multidetector CT imaging of the chest was performed during
intravenous contrast administration.
CONTRAST:  75mL 16FCB8-E33 IOPAMIDOL (16FCB8-E33) INJECTION 61%

[Series 2: axial st · axial · 0.62mm/px · z∈[-246,+10]mm · 12 of 150 slices shown, 15 images]
[im 11/150  mediastinal]
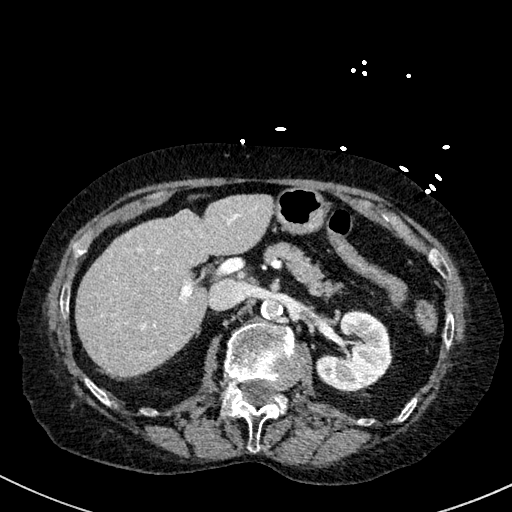
[im 11/150  lung]
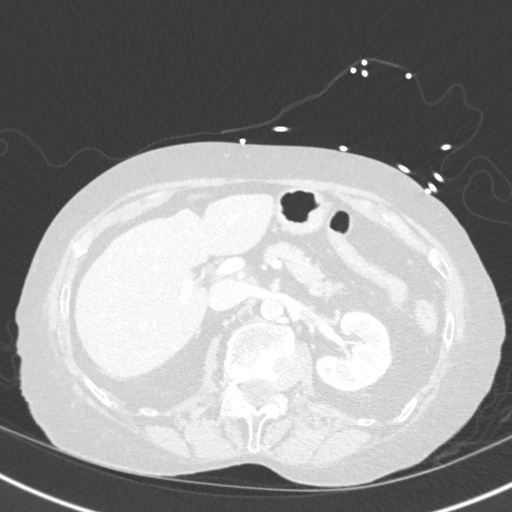
[im 22/150  lung]
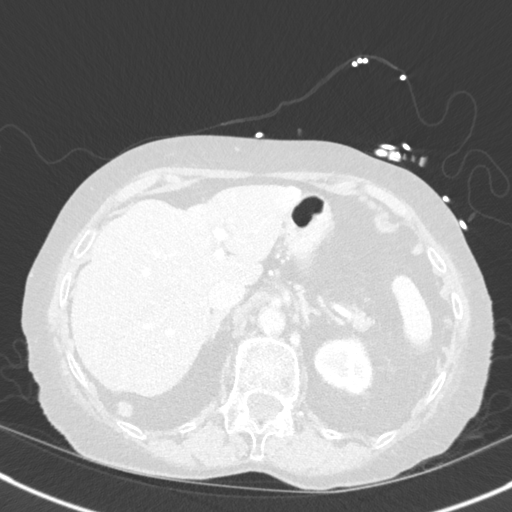
[im 32/150  lung]
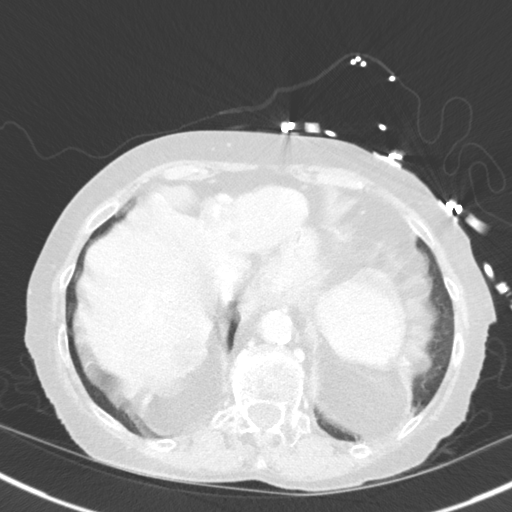
[im 43/150  lung]
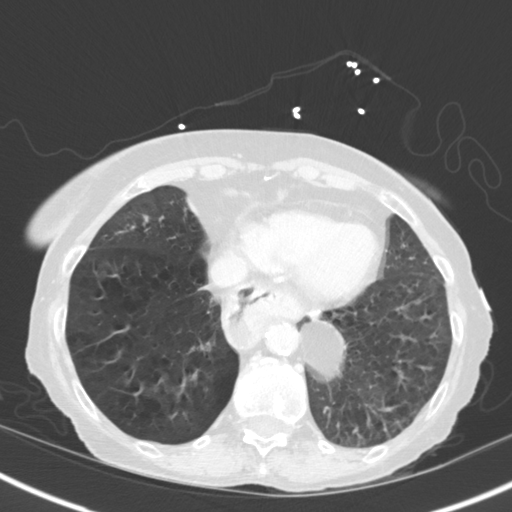
[im 54/150  mediastinal]
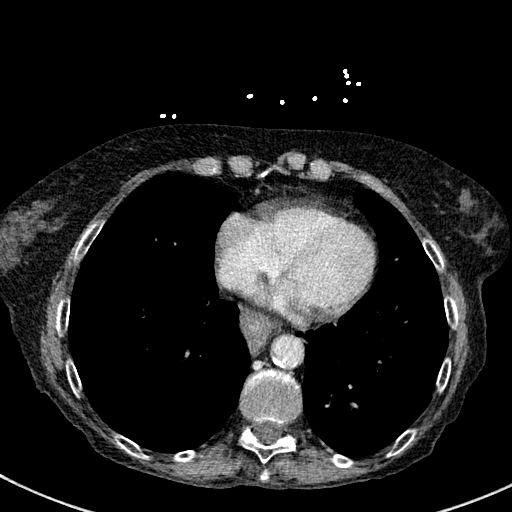
[im 54/150  lung]
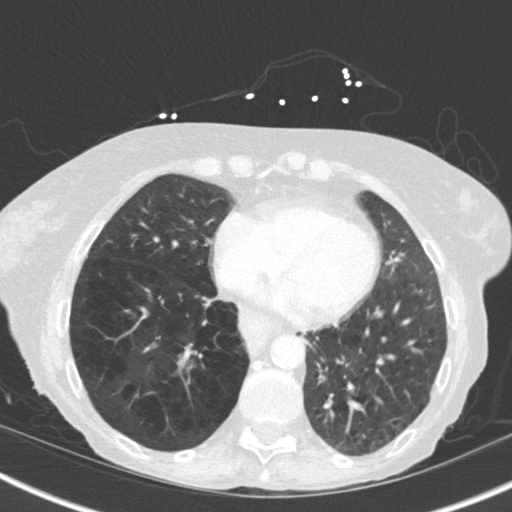
[im 64/150  lung]
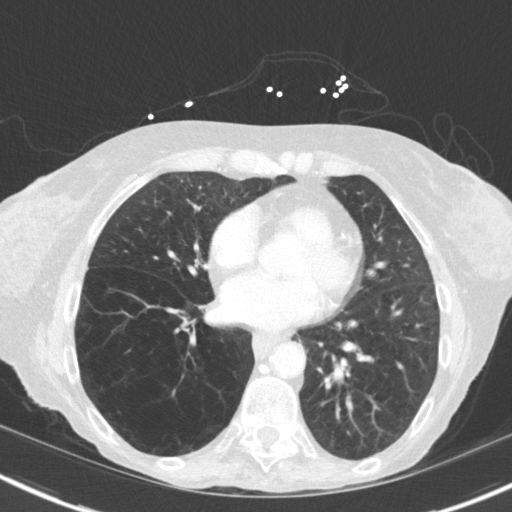
[im 86/150  lung]
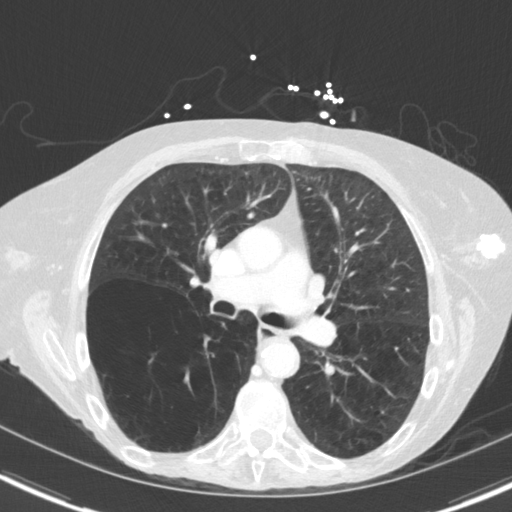
[im 96/150  lung]
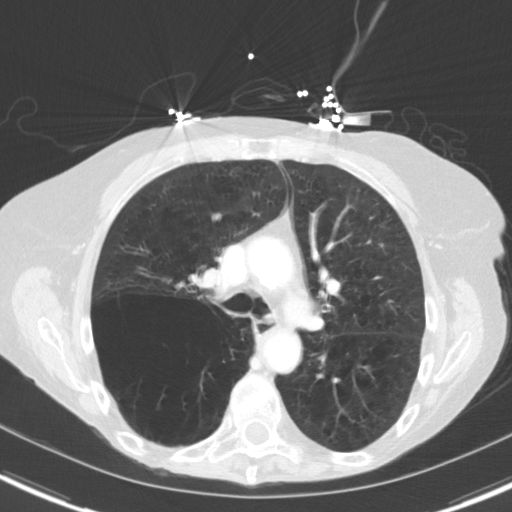
[im 107/150  mediastinal]
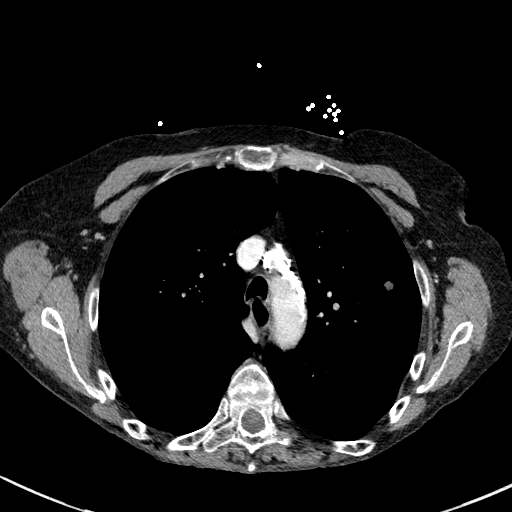
[im 107/150  lung]
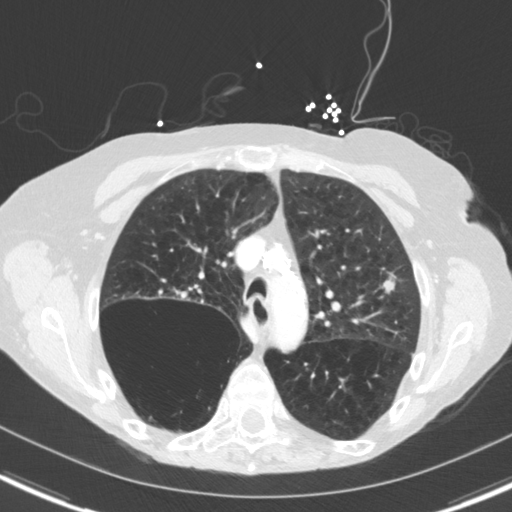
[im 118/150  lung]
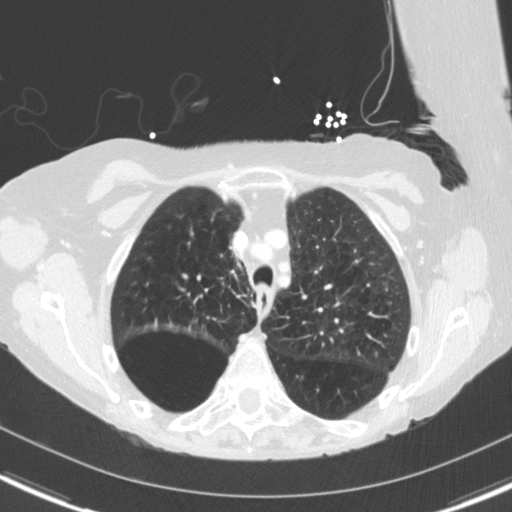
[im 128/150  lung]
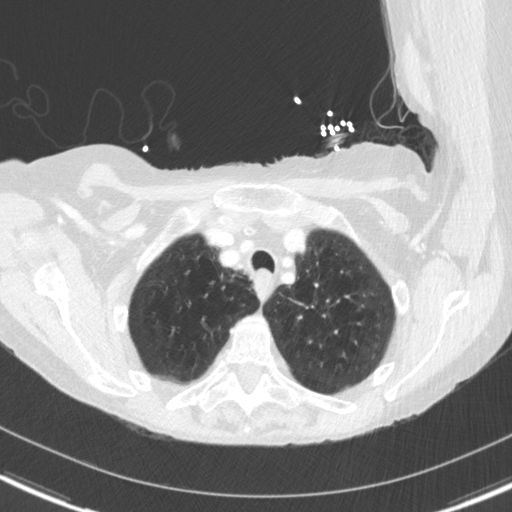
[im 139/150  lung]
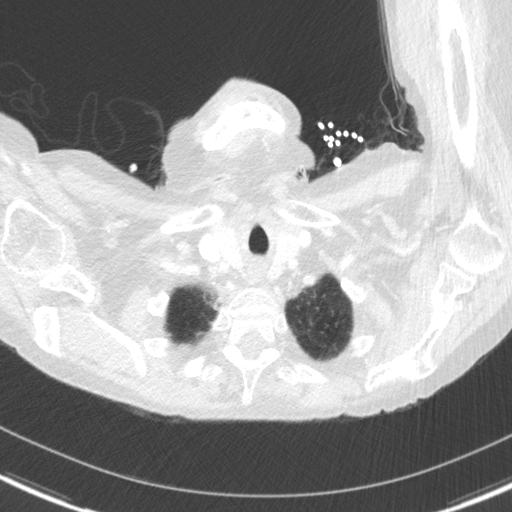

[Series 5: coronal · coronal · 0.58mm/px · 3 of 134 slices shown]
[im 27/134  lung]
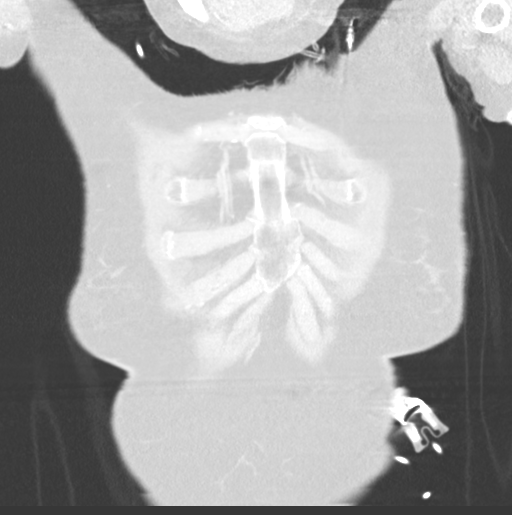
[im 54/134  lung]
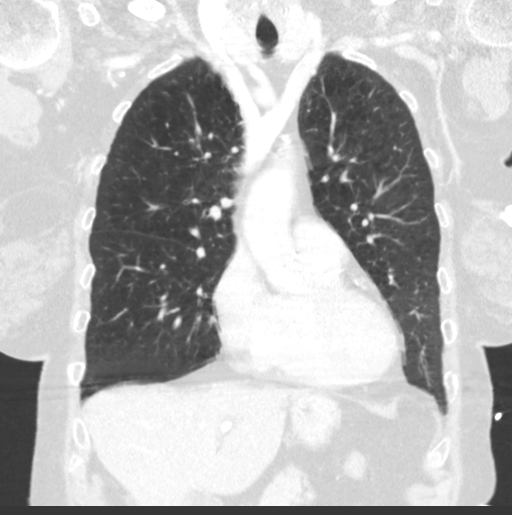
[im 80/134  lung]
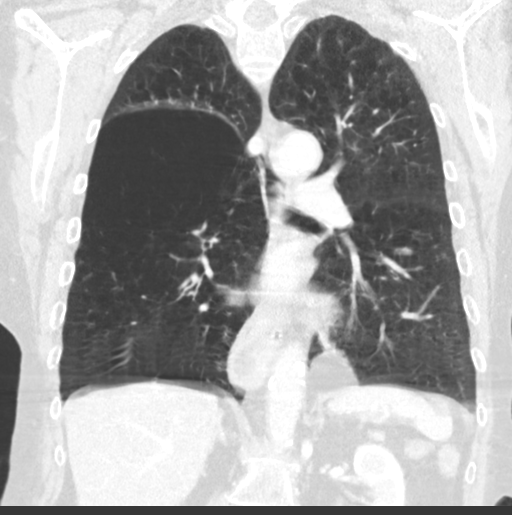

[15 of 36 positions shown; findings below may reference images not displayed]

FINDINGS: Cardiovascular: Normal heart size. No pericardial effusion. Coronary
artery atherosclerosis in the left main, lad, circumflex and RCA.
Normal caliber thoracic aorta with atherosclerosis.

Mediastinum/Nodes: No enlarged mediastinal, hilar, or axillary lymph
nodes. Thyroid gland, trachea, and esophagus demonstrate no
significant findings.

Lungs/Pleura: Severe bilateral centrilobular and paraseptal
emphysema. 2.2 x 1.7 cm pulmonary nodule in the posterior segment of
the left upper lobe most concerning for primary lung malignancy. No
other pulmonary nodule or pulmonary mass.

Upper Abdomen: Moderate size hiatal hernia. 2.6 cm hypodense left
hepatic mass with peripheral nodular enhancement most consistent
with a hemangioma. 2.2 cm hypodense right hepatic mass with
peripheral nodular enhancement most consistent with a hemangioma. No
acute upper abdominal abnormality. Fat containing left para-aortic
hernia.

Musculoskeletal: No acute osseous abnormality. No lytic or sclerotic
osseous lesion. Chronic T6 and T9 vertebral body compression
fractures.
IMPRESSION: 1. 2.2 x 1.7 cm pulmonary nodule in the posterior segment of the
left upper lobe most concerning for primary lung malignancy.
Oncology consultation is recommended.
2. Severe bilateral emphysema.
3.  Aortic Atherosclerosis (71KIT-170.0)

## 2017-08-03 DIAGNOSIS — J449 Chronic obstructive pulmonary disease, unspecified: Secondary | ICD-10-CM | POA: Diagnosis not present

## 2017-08-03 DIAGNOSIS — Z23 Encounter for immunization: Secondary | ICD-10-CM | POA: Diagnosis not present

## 2017-08-03 DIAGNOSIS — I5032 Chronic diastolic (congestive) heart failure: Secondary | ICD-10-CM | POA: Diagnosis not present

## 2017-08-03 DIAGNOSIS — I10 Essential (primary) hypertension: Secondary | ICD-10-CM | POA: Diagnosis not present

## 2017-08-03 DIAGNOSIS — J9611 Chronic respiratory failure with hypoxia: Secondary | ICD-10-CM | POA: Diagnosis not present

## 2017-08-20 ENCOUNTER — Observation Stay (HOSPITAL_COMMUNITY)
Admission: EM | Admit: 2017-08-20 | Discharge: 2017-08-24 | Disposition: A | Discharge status: Home / Self Care | Payer: Medicare Other | Attending: Internal Medicine | Admitting: Internal Medicine

## 2017-08-20 ENCOUNTER — Emergency Department (HOSPITAL_COMMUNITY): Payer: Medicare Other

## 2017-08-20 ENCOUNTER — Other Ambulatory Visit: Payer: Self-pay

## 2017-08-20 ENCOUNTER — Encounter (HOSPITAL_COMMUNITY): Payer: Self-pay | Admitting: Emergency Medicine

## 2017-08-20 DIAGNOSIS — J439 Emphysema, unspecified: Secondary | ICD-10-CM | POA: Diagnosis not present

## 2017-08-20 DIAGNOSIS — Z66 Do not resuscitate: Secondary | ICD-10-CM | POA: Diagnosis not present

## 2017-08-20 DIAGNOSIS — I272 Pulmonary hypertension, unspecified: Secondary | ICD-10-CM | POA: Insufficient documentation

## 2017-08-20 DIAGNOSIS — N183 Chronic kidney disease, stage 3 (moderate): Secondary | ICD-10-CM | POA: Insufficient documentation

## 2017-08-20 DIAGNOSIS — R0902 Hypoxemia: Secondary | ICD-10-CM

## 2017-08-20 DIAGNOSIS — R531 Weakness: Secondary | ICD-10-CM | POA: Diagnosis not present

## 2017-08-20 DIAGNOSIS — J9620 Acute and chronic respiratory failure, unspecified whether with hypoxia or hypercapnia: Secondary | ICD-10-CM | POA: Diagnosis not present

## 2017-08-20 DIAGNOSIS — J449 Chronic obstructive pulmonary disease, unspecified: Secondary | ICD-10-CM | POA: Diagnosis present

## 2017-08-20 DIAGNOSIS — C3490 Malignant neoplasm of unspecified part of unspecified bronchus or lung: Secondary | ICD-10-CM | POA: Insufficient documentation

## 2017-08-20 DIAGNOSIS — I129 Hypertensive chronic kidney disease with stage 1 through stage 4 chronic kidney disease, or unspecified chronic kidney disease: Secondary | ICD-10-CM | POA: Insufficient documentation

## 2017-08-20 DIAGNOSIS — J441 Chronic obstructive pulmonary disease with (acute) exacerbation: Secondary | ICD-10-CM | POA: Diagnosis not present

## 2017-08-20 DIAGNOSIS — R0602 Shortness of breath: Secondary | ICD-10-CM | POA: Diagnosis not present

## 2017-08-20 DIAGNOSIS — E559 Vitamin D deficiency, unspecified: Secondary | ICD-10-CM | POA: Diagnosis not present

## 2017-08-20 DIAGNOSIS — Z9981 Dependence on supplemental oxygen: Secondary | ICD-10-CM | POA: Insufficient documentation

## 2017-08-20 DIAGNOSIS — J90 Pleural effusion, not elsewhere classified: Secondary | ICD-10-CM | POA: Insufficient documentation

## 2017-08-20 DIAGNOSIS — Z87891 Personal history of nicotine dependence: Secondary | ICD-10-CM | POA: Insufficient documentation

## 2017-08-20 DIAGNOSIS — I10 Essential (primary) hypertension: Secondary | ICD-10-CM | POA: Diagnosis present

## 2017-08-20 DIAGNOSIS — R000 Tachycardia, unspecified: Secondary | ICD-10-CM | POA: Diagnosis not present

## 2017-08-20 DIAGNOSIS — Z79899 Other long term (current) drug therapy: Secondary | ICD-10-CM | POA: Insufficient documentation

## 2017-08-20 DIAGNOSIS — I2723 Pulmonary hypertension due to lung diseases and hypoxia: Secondary | ICD-10-CM | POA: Diagnosis present

## 2017-08-20 HISTORY — DX: Malignant (primary) neoplasm, unspecified: C80.1

## 2017-08-20 LAB — BASIC METABOLIC PANEL
ANION GAP: 9 (ref 5–15)
BUN: 31 mg/dL — ABNORMAL HIGH (ref 6–20)
CHLORIDE: 96 mmol/L — AB (ref 101–111)
CO2: 32 mmol/L (ref 22–32)
Calcium: 9.2 mg/dL (ref 8.9–10.3)
Creatinine, Ser: 1.4 mg/dL — ABNORMAL HIGH (ref 0.44–1.00)
GFR calc Af Amer: 41 mL/min — ABNORMAL LOW (ref >60)
GFR calc non Af Amer: 35 mL/min — ABNORMAL LOW (ref >60)
GLUCOSE: 134 mg/dL — AB (ref 65–99)
POTASSIUM: 3.8 mmol/L (ref 3.5–5.1)
Sodium: 137 mmol/L (ref 135–145)

## 2017-08-20 LAB — PROTIME-INR
INR: 0.97
PROTHROMBIN TIME: 12.8 s (ref 11.4–15.2)

## 2017-08-20 LAB — LACTIC ACID, PLASMA
Lactic Acid, Venous: 1.1 mmol/L (ref 0.5–1.9)
Lactic Acid, Venous: 0.9 mmol/L (ref 0.5–1.9)

## 2017-08-20 LAB — CBC WITH DIFFERENTIAL/PLATELET
Basophils Absolute: 0 10*3/uL (ref 0.0–0.1)
Basophils Relative: 1 %
EOS ABS: 0.2 10*3/uL (ref 0.0–0.7)
EOS PCT: 4 %
HCT: 38.3 % (ref 36.0–46.0)
Hemoglobin: 11.4 g/dL — ABNORMAL LOW (ref 12.0–15.0)
LYMPHS ABS: 1 10*3/uL (ref 0.7–4.0)
Lymphocytes Relative: 16 %
MCH: 29.8 pg (ref 26.0–34.0)
MCHC: 29.8 g/dL — AB (ref 30.0–36.0)
MCV: 100 fL (ref 78.0–100.0)
MONO ABS: 0.8 10*3/uL (ref 0.1–1.0)
MONOS PCT: 13 %
Neutro Abs: 4 10*3/uL (ref 1.7–7.7)
Neutrophils Relative %: 66 %
PLATELETS: 472 10*3/uL — AB (ref 150–400)
RBC: 3.83 MIL/uL — AB (ref 3.87–5.11)
RDW: 12.2 % (ref 11.5–15.5)
WBC: 6 10*3/uL (ref 4.0–10.5)

## 2017-08-20 LAB — TROPONIN I: Troponin I: <0.03 ng/mL (ref <0.03)

## 2017-08-20 LAB — BRAIN NATRIURETIC PEPTIDE: B Natriuretic Peptide: 126 pg/mL — ABNORMAL HIGH (ref 0.0–100.0)

## 2017-08-20 MED ORDER — ALBUTEROL SULFATE (2.5 MG/3ML) 0.083% IN NEBU: 2.5000 mg | INHALATION_SOLUTION | Freq: Four times a day (QID) | RESPIRATORY_TRACT | Status: DC

## 2017-08-20 MED ORDER — ALBUTEROL SULFATE (2.5 MG/3ML) 0.083% IN NEBU: 2.5000 mg | INHALATION_SOLUTION | RESPIRATORY_TRACT | Status: DC | PRN

## 2017-08-20 MED ORDER — ENOXAPARIN SODIUM 30 MG/0.3ML ~~LOC~~ SOLN
30.0000 mg | SUBCUTANEOUS | Status: DC
  Administered 2017-08-20 – 2017-08-23 (×4): 30 mg via SUBCUTANEOUS
  Filled 2017-08-20 (×4): qty 0.3

## 2017-08-20 MED ORDER — LISINOPRIL 5 MG PO TABS
5.0000 mg | ORAL_TABLET | Freq: Every day | ORAL | Status: DC
  Administered 2017-08-21 – 2017-08-22 (×2): 5 mg via ORAL
  Filled 2017-08-20 (×2): qty 1

## 2017-08-20 MED ORDER — IPRATROPIUM BROMIDE 0.02 % IN SOLN: 0.5000 mg | Freq: Four times a day (QID) | RESPIRATORY_TRACT | Status: DC

## 2017-08-20 MED ORDER — METHYLPREDNISOLONE SODIUM SUCC 40 MG IJ SOLR
40.0000 mg | Freq: Four times a day (QID) | INTRAMUSCULAR | Status: DC
  Administered 2017-08-21 (×2): 40 mg via INTRAVENOUS
  Filled 2017-08-20 (×3): qty 1

## 2017-08-20 MED ORDER — IPRATROPIUM BROMIDE 0.02 % IN SOLN
1.0000 mg | Freq: Once | RESPIRATORY_TRACT | Status: AC
  Administered 2017-08-20: 1 mg via RESPIRATORY_TRACT
  Filled 2017-08-20: qty 5

## 2017-08-20 MED ORDER — FUROSEMIDE 20 MG PO TABS
20.0000 mg | ORAL_TABLET | Freq: Two times a day (BID) | ORAL | Status: DC
  Administered 2017-08-21 – 2017-08-22 (×3): 20 mg via ORAL
  Filled 2017-08-20 (×3): qty 1

## 2017-08-20 MED ORDER — ALBUTEROL (5 MG/ML) CONTINUOUS INHALATION SOLN
10.0000 mg/h | INHALATION_SOLUTION | Freq: Once | RESPIRATORY_TRACT | Status: AC
  Administered 2017-08-20: 10 mg/h via RESPIRATORY_TRACT
  Filled 2017-08-20: qty 20

## 2017-08-20 MED ORDER — LORAZEPAM 2 MG/ML IJ SOLN
0.5000 mg | Freq: Once | INTRAMUSCULAR | Status: AC
  Administered 2017-08-20: 0.5 mg via INTRAVENOUS
  Filled 2017-08-20: qty 1

## 2017-08-20 MED ORDER — IPRATROPIUM-ALBUTEROL 0.5-2.5 (3) MG/3ML IN SOLN
3.0000 mL | Freq: Four times a day (QID) | RESPIRATORY_TRACT | Status: DC
  Administered 2017-08-20 – 2017-08-21 (×2): 3 mL via RESPIRATORY_TRACT
  Filled 2017-08-20 (×2): qty 3

## 2017-08-20 MED ORDER — METHYLPREDNISOLONE SODIUM SUCC 125 MG IJ SOLR
125.0000 mg | Freq: Once | INTRAMUSCULAR | Status: AC
  Administered 2017-08-20: 125 mg via INTRAVENOUS
  Filled 2017-08-20: qty 2

## 2017-08-20 NOTE — ED Provider Notes (Signed)
[facility] EMERGENCY DEPARTMENT Provider Note   CSN: [id number] Arrival date & time: 08/20/17  1441     History   Chief Complaint Chief Complaint  Patient presents with  . Shortness of Breath    HPI Cheyenne Case is a 76 y.o. female.  HPI  Pt was seen at 1550.  Per pt and her family, c/o gradual onset and worsening of persistent SOB since yesterday. Pt has been wearing her O2 N/C without relief.   Pt has hx of COPD and lung CA. Pt and family state pt is not undergoing treatment for her lung CA due to "her underlying really bad emphysema."  Has been associated with generalized weakness and "feeling like my heart is quivering."  Has been unable to walk today due to her symptoms. Denies CP/palpitations, no back pain, no abd pain, no N/V/D, no fevers, no rash.    Past Medical History:  Diagnosis Date  . Cancer ([place])   . Chronic kidney disease 11/2011.   Creatinine 1.4-1.8.  Marland Kitchen COPD (chronic obstructive pulmonary disease) ([place])   . Emphysema   . Hypertension   . Physical deconditioning 12/24/2011  . Vitamin D deficiency 12/24/2011    Patient Active Problem List   Diagnosis Date Noted  . Lung cancer ([place])   . Palliative care encounter   . Goals of care, counseling/discussion   . DNR (do not resuscitate) discussion   . Muscle weakness (generalized)   . COPD (chronic obstructive pulmonary disease) ([place]) 11/03/2016  . COPD exacerbation ([place]) 11/03/2016  . Vitamin D deficiency 12/24/2011  . Volume overload 12/24/2011  . Physical deconditioning 12/24/2011  . 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 Acute bronchitis 12/19/2011  . COPD with acute exacerbation ([place]) 12/19/2011  . Weakness generalized 12/19/2011  . Diarrhea 12/19/2011  . Anemia 12/19/2011  . Anorexia 12/19/2011  . Dehydration 12/19/2011  . Acute renal insufficiency 12/19/2011  . Benign hypertension 12/19/2011  . Obesity 12/19/2011    History reviewed. No pertinent surgical history.  OB  History    No data available       Home Medications    Prior to Admission medications   Medication Sig Start Date End Date Taking? Authorizing Provider  albuterol (PROVENTIL) (2.5 MG/3ML) 0.083% nebulizer solution Take 2.5 mg by nebulization every 4 (four) hours as needed. For shortness of breath    [provider]  budesonide-formoterol (SYMBICORT) 160-4.5 MCG/ACT inhaler Inhale 2 puffs into the lungs 2 (two) times daily.    [provider]  furosemide (LASIX) 20 MG tablet Take 20 mg by mouth 2 (two) times daily.    [provider]  lisinopril (PRINIVIL,ZESTRIL) 5 MG tablet Take 1 tablet (5 mg total) by mouth daily. 12/25/11   Rexene Alberts, MD  SPIRIVA HANDIHALER 18 MCG inhalation capsule Take 1 capsule by mouth daily at 2 PM. 09/14/16   [provider]    Family History History reviewed. No pertinent family history.  Social History Social History   Tobacco Use  . Smoking status: Former Research scientist (life sciences)  . Smokeless tobacco: Never Used  Substance Use Topics  . Alcohol use: No  . Drug use: No     Allergies   Patient has no known allergies.   Review of Systems Review of Systems ROS: Statement: All systems negative except as marked or noted in the HPI; Constitutional: Negative for fever and chills. ; ; Eyes: Negative for eye pain, redness and discharge. ; ; ENMT: Negative for ear pain, hoarseness,  nasal congestion, sinus pressure and sore throat. ; ; Cardiovascular: Negative for chest pain, palpitations, diaphoresis, and peripheral edema. ; ; Respiratory: +SOB. Negative for stridor. ; ; Gastrointestinal: Negative for nausea, vomiting, diarrhea, abdominal pain, blood in stool, hematemesis, jaundice and rectal bleeding. . ; ; Genitourinary: Negative for dysuria, flank pain and hematuria. ; ; Musculoskeletal: Negative for back pain and neck pain. Negative for swelling and trauma.; ; Skin: Negative for pruritus, rash, abrasions, blisters, bruising and skin  lesion.; ; Neuro: Negative for headache, lightheadedness and neck stiffness. Negative for weakness, altered level of consciousness, altered mental status, extremity weakness, paresthesias, involuntary movement, seizure and syncope.       Physical Exam Updated Vital Signs BP (!) 147/74   Pulse 86   Temp 98.3 F (36.8 C) (Oral)   Resp (!) 24   Ht _0  (1.575 m)   Wt 47.6 kg (105 lb)   SpO2 100%   BMI 19.20 kg/m   Patient Vitals for the past 24 hrs:  BP Temp Temp src Pulse Resp SpO2 Height Weight  08/20/17 1700 - - - (!) 113 (!) 26 96 % - -  08/20/17 1635 - - - - - 99 % - -  08/20/17 1610 (!) 147/74 - - 86 (!) 24 100 % - -  08/20/17 1601 - - - - - - _1  (1.575 m) 47.6 kg (105 lb)  08/20/17 1529 135/60 98.3 F (36.8 C) Oral 94 (!) 26 (!) 88 % - -     Physical Exam 1555: Physical examination:  Nursing notes reviewed; Vital signs and O2 SAT reviewed;  Constitutional: Well developed, Well nourished, Well hydrated, Uncomfortable appearing.;; Head:  Normocephalic, atraumatic; Eyes: EOMI, PERRL, No scleral icterus; ENMT: Mouth and pharynx normal, Mucous membranes moist; Neck: Supple, Full range of motion, No lymphadenopathy; Cardiovascular: Tachycardic rate and rhythm, No gallop; Respiratory: Breath sounds diminished & equal bilaterally, faint insp/exp wheezes bilat. Faint audible wheezing.  Speaking words, sitting upright, tachypneic.; Chest: Nontender, Movement normal; Abdomen: Soft, Nontender, Nondistended, Normal bowel sounds; Genitourinary: No CVA tenderness; Extremities: Pulses normal, No tenderness, No edema, No calf edema or asymmetry.; Neuro: AA&Ox3, Major CN grossly intact.  Speech clear. No gross focal motor or sensory deficits in extremities.; Skin: Color normal, Warm, Dry.    ED Treatments / Results  Labs (all labs ordered are listed, but only abnormal results are displayed)   EKG  EKG Interpretation  Date/Time:  Friday August 20 2017 15:34:13 EST Ventricular Rate:    98 PR Interval:  112 QRS Duration: 64 QT Interval:  362 QTC Calculation: 462 R Axis:   90 Text Interpretation:  Normal sinus rhythm Rightward axis Nonspecific ST and T wave abnormality Artifact When compared with ECG of 11/06/2016 Rate slower Artifact is present Otherwise no significant change Confirmed by Francine Graven [phone]) on 08/20/2017 4:29:54 PM       Radiology   Procedures Procedures (including critical care time)  Medications Ordered in ED Medications  albuterol (PROVENTIL,VENTOLIN) solution continuous neb (not administered)  ipratropium (ATROVENT) nebulizer solution 1 mg (not administered)  methylPREDNISolone sodium succinate (SOLU-MEDROL) 125 mg/2 mL injection 125 mg (125 mg Intravenous Given 08/20/17 1614)     Initial Impression / Assessment and Plan / ED Course  I have reviewed the triage vital signs and the nursing notes.  Pertinent labs & imaging results that were available during my care of the patient were reviewed by me and considered in my medical decision making (see chart for details).  MDM  Reviewed: previous chart, nursing note and vitals Reviewed previous: labs and ECG Interpretation: labs, ECG and x-ray Total time providing critical care: 30-74 minutes. This excludes time spent performing separately reportable procedures and services. Consults: admitting MD    CRITICAL CARE Performed by: Alfonzo Feller Total critical care time: 35 minutes Critical care time was exclusive of separately billable procedures and treating other patients. Critical care was necessary to treat or prevent imminent or life-threatening deterioration. Critical care was time spent personally by me on the following activities: development of treatment plan with patient and/or surrogate as well as nursing, discussions with consultants, evaluation of patient's response to treatment, examination of patient, obtaining history from patient or surrogate, ordering and performing  treatments and interventions, ordering and review of laboratory studies, ordering and review of radiographic studies, pulse oximetry and re-evaluation of patient's condition.   Results for orders placed or performed during the hospital encounter of 08/20/17  CBC with Differential  Result Value Ref Range   WBC 6.0 4.0 - 10.5 K/uL   RBC 3.83 (L) 3.87 - 5.11 MIL/uL   Hemoglobin 11.4 (L) 12.0 - 15.0 g/dL   HCT 38.3 36.0 - 46.0 %   MCV 100.0 78.0 - 100.0 fL   MCH 29.8 26.0 - 34.0 pg   MCHC 29.8 (L) 30.0 - 36.0 g/dL   RDW 12.2 11.5 - 15.5 %   Platelets 472 (H) 150 - 400 K/uL   Neutrophils Relative % 66 %   Neutro Abs 4.0 1.7 - 7.7 K/uL   Lymphocytes Relative 16 %   Lymphs Abs 1.0 0.7 - 4.0 K/uL   Monocytes Relative 13 %   Monocytes Absolute 0.8 0.1 - 1.0 K/uL   Eosinophils Relative 4 %   Eosinophils Absolute 0.2 0.0 - 0.7 K/uL   Basophils Relative 1 %   Basophils Absolute 0.0 0.0 - 0.1 K/uL  Protime-INR  Result Value Ref Range   Prothrombin Time 12.8 11.4 - 15.2 seconds   INR 0.97   Troponin I  Result Value Ref Range   Troponin I <0.03 <0.03 ng/mL  Lactic acid, plasma  Result Value Ref Range   Lactic Acid, Venous 1.1 0.5 - 1.9 mmol/L  Lactic acid, plasma  Result Value Ref Range   Lactic Acid, Venous 0.9 0.5 - 1.9 mmol/L  Basic metabolic panel  Result Value Ref Range   Sodium 137 135 - 145 mmol/L   Potassium 3.8 3.5 - 5.1 mmol/L   Chloride 96 (L) 101 - 111 mmol/L   CO2 32 22 - 32 mmol/L   Glucose, Bld 134 (H) 65 - 99 mg/dL   BUN 31 (H) 6 - 20 mg/dL   Creatinine, Ser 1.40 (H) 0.44 - 1.00 mg/dL   Calcium 9.2 8.9 - 10.3 mg/dL   GFR calc non Af Amer 35 (L) >60 mL/min   GFR calc Af Amer 41 (L) >60 mL/min   Anion gap 9 5 - 15  Brain natriuretic peptide  Result Value Ref Range   B Natriuretic Peptide 126.0 (H) 0.0 - 100.0 pg/mL   Dg Chest Port 1 View Result Date: 08/20/2017 CLINICAL DATA:  Shortness of breath. EXAM: PORTABLE CHEST 1 VIEW COMPARISON:  Radiographs and CT scan  of November 03, 2016. FINDINGS: Stable cardiomediastinal silhouette. Atherosclerosis of thoracic aorta is noted. Lobulated mass is noted in left upper lobe which is significantly increased in size compared to prior exam and consistent with malignancy. No pneumothorax is noted. Minimal bibasilar subsegmental atelectasis is noted. Minimal bilateral pleural effusions  are noted. Bony thorax is unremarkable. IMPRESSION: Left upper lobe mass is significantly enlarged compared to prior exam, consistent with malignancy. CT scan of the chest is recommended for further evaluation Probable minimal bibasilar subsegmental atelectasis is noted with minimal bilateral pleural effusions. Electronically Signed   By: Marijo Conception, M.D.   On: 08/20/2017 16:28    1930:  On arrival: pt sitting upright, tachypneic, tachycardic, Sats 88 % on O2 5L N/C, lungs diminished with wheezing. IV solumedrol and hour long neb started. Small dose of IV ativan was given during neb treatment due to pt fighting with family, staff and neb/O2.  After neb: pt calmer, appears more comfortable at rest, less tachypneic, Sats 96 % on O2 3L N/C, lungs continue diminished.  Pt attempted to stand at bedside with heavy staff assist, became too weak and SOB. Pt placed back to stretcher. Dx and testing d/w pt and family.  Questions answered.  Verb understanding, agreeable to admit.  T/C to [facility] Dr. Olevia Bowens, case discussed, including:  HPI, pertinent PM/SHx, VS/PE, dx testing, ED course and treatment:  Agreeable to admit.    Final Clinical Impressions(s) / ED Diagnoses   Final diagnoses:  None    ED Discharge Orders    None       Francine Graven, DO 08/25/17 1832

## 2017-08-20 NOTE — H&P (Signed)
History and Physical    Cheyenne Case PYK:[id number] DOB: 05-06-1941 DOA: 08/20/2017  PCP: [name], MD   Patient coming from: Home.  I have personally briefly reviewed patient's old medical records in [place]  Chief Complaint: Shortness of breath since yesterday.  HPI: Cheyenne Case is a 76 y.o. female with medical history significant of lung cancer, chronic kidney disease, COPD on home oxygen at 2.5 LPM, emphysema, hypertension, physical deconditioning, vitamin D deficiency, pulmonary hypertension who is coming to the emergency department with complaints of progressively worse shortness of breath despite increasing her oxygen to 4 LPM.  This is associated with dry cough and wheezing.  Patient also stated that she has been feeling her heart "quivering", but she has been using a lot of albuterol.  She denies fever, chills, but feels fatigued.  No sore throat, hemoptysis, chest pain, dizziness, diaphoresis, lower extremity edema, abdominal pain, nausea, emesis, diarrhea, melena or hematochezia.  Denies GU symptoms.  No polyuria or polydipsia.  ED Course: Initial vital signs temperature 98.43F, pulse 94, respirations 26, blood pressure 135/60 mmHg and O2 sat 88% on 4 L nasal cannula.  She received a 10 mg albuterol +1 mg Atrovent neb treatment, Solu-Medrol 125 mg IVP and lorazepam 0.5 mg IVP x1 dose.  Her lab work shows WBC of 6.0 with normal differential, hemoglobin 11.4 g/dL and platelets 472.  PT and INR were normal.  Normal troponin, normal lactic acid x2.  ProBNP was mildly elevated at 126 pg/mL.  Her BMP showed a chloride level 96, bicarbonate 32, sodium 137 and potassium 3.9 millimol/L.  Her glucose was 134, BUN 31 and creatinine 1.4 mg/dL.  Her chest radiograph shows probable minimal bibasilar subsegmental atelectasis with minimal bilateral pleural effusions.  Please see image and full radiology report for further detail.  Review of Systems: As per HPI otherwise 10 point  review of systems negative.    Past Medical History:  Diagnosis Date  . Cancer ([place])   . Chronic kidney disease 11/2011.   Creatinine 1.4-1.8.  Marland Kitchen COPD (chronic obstructive pulmonary disease) ([place])   . Emphysema   . Hypertension   . Physical deconditioning 12/24/2011  . Vitamin D deficiency 12/24/2011    History reviewed. No pertinent surgical history.   reports that she has quit smoking. she has never used smokeless tobacco. She reports that she does not drink alcohol or use drugs.  No Known Allergies  Family History  Problem Relation Age of Onset  . Hypertension Other     Prior to Admission medications   Medication Sig Start Date End Date Taking? Authorizing Provider  albuterol (PROVENTIL) (2.5 MG/3ML) 0.083% nebulizer solution Take 2.5 mg by nebulization every 6 (six) hours as needed. For shortness of breath    Yes [provider]  budesonide-formoterol (SYMBICORT) 160-4.5 MCG/ACT inhaler Inhale 2 puffs into the lungs 2 (two) times daily.   Yes [provider]  furosemide (LASIX) 20 MG tablet Take 20 mg by mouth 2 (two) times daily.   Yes [provider]  lisinopril (PRINIVIL,ZESTRIL) 5 MG tablet Take 1 tablet (5 mg total) by mouth daily. 12/25/11  Yes Rexene Alberts, MD  SPIRIVA HANDIHALER 18 MCG inhalation capsule Take 1 capsule by mouth daily at 2 PM. 09/14/16  Yes [provider]    Physical Exam: Vitals:   08/20/17 1700 08/20/17 1900 08/20/17 2130 08/20/17 2238  BP:  129/60 (!) 154/52   Pulse: (!) 113 (!) 109 94   Resp: (!) 26 (!)  24 20   Temp:   97.6 F (36.4 C)   TempSrc:   Oral   SpO2: 96% 98% 96% 96%  Weight:   48.9 kg (107 lb 12.9 oz)   Height:   5' 2.5" (1.588 m)     Constitutional: NAD, calm, comfortable Eyes: PERRL, lids and conjunctivae normal ENMT: Mucous membranes are mildly dry. Posterior pharynx clear of any exudate or lesions. Neck: normal, supple, no masses, no thyromegaly Respiratory: Decreased breath sounds  with wheezing bilaterally.  No crackles or rhonchi. Normal respiratory effort. No accessory muscle use.  Cardiovascular: Regular rate and rhythm, no murmurs / rubs / gallops. No extremity edema. 2+ pedal pulses. No carotid bruits.  Abdomen: Soft, no tenderness, no masses palpated. No hepatosplenomegaly. Bowel sounds positive.  Musculoskeletal: no clubbing / cyanosis. Good ROM, no contractures. Normal muscle tone.  Skin: Small areas of ecchymosis on upper extremities.  Mild erythema or mid back. Neurologic: CN 2-12 grossly intact. Sensation intact, DTR normal. Strength 5/5 in all 4.  Psychiatric: Normal judgment and insight. Alert and oriented x 3. Normal mood.    Labs on Admission: I have personally reviewed following labs and imaging studies  CBC: Recent Labs  Lab 08/20/17 1600  WBC 6.0  NEUTROABS 4.0  HGB 11.4*  HCT 38.3  MCV 100.0  PLT 528*   Basic Metabolic Panel: Recent Labs  Lab 08/20/17 1600  NA 137  K 3.8  CL 96*  CO2 32  GLUCOSE 134*  BUN 31*  CREATININE 1.40*  CALCIUM 9.2   GFR: Estimated Creatinine Clearance: 26 mL/min (A) (by C-G formula based on SCr of 1.4 mg/dL (H)). Liver Function Tests: No results for input(s): AST, ALT, ALKPHOS, BILITOT, PROT, ALBUMIN in the last 168 hours. No results for input(s): LIPASE, AMYLASE in the last 168 hours. No results for input(s): AMMONIA in the last 168 hours. Coagulation Profile: Recent Labs  Lab 08/20/17 1600  INR 0.97   Cardiac Enzymes: Recent Labs  Lab 08/20/17 1600  TROPONINI <0.03   BNP (last 3 results) No results for input(s): PROBNP in the last 8760 hours. HbA1C: No results for input(s): HGBA1C in the last 72 hours. CBG: No results for input(s): GLUCAP in the last 168 hours. Lipid Profile: No results for input(s): CHOL, HDL, LDLCALC, TRIG, CHOLHDL, LDLDIRECT in the last 72 hours. Thyroid Function Tests: No results for input(s): TSH, T4TOTAL, FREET4, T3FREE, THYROIDAB in the last 72 hours. Anemia  Panel: No results for input(s): VITAMINB12, FOLATE, FERRITIN, TIBC, IRON, RETICCTPCT in the last 72 hours. Urine analysis:    Component Value Date/Time   COLORURINE YELLOW 05/24/2009 0309   APPEARANCEUR CLEAR 05/24/2009 0309   LABSPEC >1.030 (H) 05/24/2009 0309   PHURINE 5.5 05/24/2009 0309   GLUCOSEU NEGATIVE 05/24/2009 0309   HGBUR TRACE (A) 05/24/2009 0309   BILIRUBINUR NEGATIVE 05/24/2009 0309   KETONESUR NEGATIVE 05/24/2009 0309   PROTEINUR NEGATIVE 05/24/2009 0309   UROBILINOGEN 0.2 05/24/2009 0309   NITRITE NEGATIVE 05/24/2009 0309   LEUKOCYTESUR NEGATIVE 05/24/2009 0309    Radiological Exams on Admission: Dg Chest Port 1 View  Result Date: 08/20/2017 CLINICAL DATA:  Shortness of breath. EXAM: PORTABLE CHEST 1 VIEW COMPARISON:  Radiographs and CT scan of November 03, 2016. FINDINGS: Stable cardiomediastinal silhouette. Atherosclerosis of thoracic aorta is noted. Lobulated mass is noted in left upper lobe which is significantly increased in size compared to prior exam and consistent with malignancy. No pneumothorax is noted. Minimal bibasilar subsegmental atelectasis is noted. Minimal bilateral pleural effusions  are noted. Bony thorax is unremarkable. IMPRESSION: Left upper lobe mass is significantly enlarged compared to prior exam, consistent with malignancy. CT scan of the chest is recommended for further evaluation Probable minimal bibasilar subsegmental atelectasis is noted with minimal bilateral pleural effusions. Electronically Signed   By: Marijo Conception, M.D.   On: 08/20/2017 16:28  12/22/2011 echocardiogram without contrast  ------------------------------------------------------------ LV EF: 75%  ------------------------------------------------------------ Indications:   CHF - 428.0.  ------------------------------------------------------------ History:  PMH:  Chronic obstructive pulmonary disease. PMH: acute bronchitis, former smoker Risk  factors: Hypertension.  ------------------------------------------------------------ Study Conclusions  - Left ventricle: The cavity size was moderately reduced. There was mild concentric hypertrophy. Systolic function was hyperdynamic. The estimated ejection fraction was 75%. - Aortic valve: Mildly calcified annulus. Trileaflet. - Right ventricle: The cavity size was normal. Wall thickness was mildly increased. - Atrial septum: No defect or patent foramen ovale was identified. - Pulmonary arteries: Systolic pressure was mildly increased. PA peak pressure: 57m Hg (S).  EKG: Independently reviewed.  Vent. rate 98 BPM PR interval 112 ms QRS duration 64 ms QT/QTc 362/462 ms P-R-T axes 85 90 79 Normal sinus rhythm Rightward axis Nonspecific ST and T wave abnormality Artifact  Assessment/Plan Principal problem:   COPD exacerbation (HCC) Telemetry/observation. Continue supplemental oxygen. Duo nebs every 6 hours. Albuterol 2.5 mg neb every 4 hours as needed. Continue Solu-Medrol at 40 mg every 6 hours for 24 hours.  Active Problems:   Benign hypertension Continue lisinopril 5 mg p.o. daily. On furosemide. Monitor blood pressure, renal function and electrolytes.    Pulmonary hypertension due to COPD (HCC) Continue furosemide 20 mg p.o. daily.    Lung cancer ([place] Currently not getting treatment, due to severe emphysema.    DVT prophylaxis: Lovenox SQ. Code Status: DNR. Family Communication:  Disposition Plan: Observation for COPD exacerbation. Consults called:  Admission status: Observation/telemetry.   DReubin MilanMD [facility] Pager [phone]  If 7PM-7AM, please contact night-coverage [email or web address] Password [email or web address] 08/20/2017, 11:06 PM

## 2017-08-20 NOTE — ED Notes (Signed)
PT had difficulty with ambulation and was only able to make it to the door way with 2 person assist and had poor balance and shuffled gait with SOB noted but sats stayed at 96% on 3L per N/C. PT stated she felt extremely weak.

## 2017-08-20 NOTE — ED Triage Notes (Signed)
Pt has lung cancer. No treatments at this time. On home 02 at 2.5L Sob started yesterday and worse today. 02 increased to 4L  with no relief. A/o. Pt feels like her heart is quivering.

## 2017-08-21 ENCOUNTER — Encounter (HOSPITAL_COMMUNITY): Payer: Self-pay | Admitting: Internal Medicine

## 2017-08-21 DIAGNOSIS — J9621 Acute and chronic respiratory failure with hypoxia: Secondary | ICD-10-CM | POA: Diagnosis not present

## 2017-08-21 DIAGNOSIS — J441 Chronic obstructive pulmonary disease with (acute) exacerbation: Secondary | ICD-10-CM | POA: Diagnosis not present

## 2017-08-21 LAB — CBC
HCT: 34.3 % — ABNORMAL LOW (ref 36.0–46.0)
HEMOGLOBIN: 10.5 g/dL — AB (ref 12.0–15.0)
MCH: 30.7 pg (ref 26.0–34.0)
MCHC: 30.6 g/dL (ref 30.0–36.0)
MCV: 100.3 fL — ABNORMAL HIGH (ref 78.0–100.0)
Platelets: 454 10*3/uL — ABNORMAL HIGH (ref 150–400)
RBC: 3.42 MIL/uL — AB (ref 3.87–5.11)
RDW: 12.3 % (ref 11.5–15.5)
WBC: 4.5 10*3/uL (ref 4.0–10.5)

## 2017-08-21 LAB — BASIC METABOLIC PANEL
ANION GAP: 10 (ref 5–15)
BUN: 39 mg/dL — ABNORMAL HIGH (ref 6–20)
CALCIUM: 8.9 mg/dL (ref 8.9–10.3)
CO2: 29 mmol/L (ref 22–32)
Chloride: 101 mmol/L (ref 101–111)
Creatinine, Ser: 1.6 mg/dL — ABNORMAL HIGH (ref 0.44–1.00)
GFR, EST AFRICAN AMERICAN: 35 mL/min — AB (ref >60)
GFR, EST NON AFRICAN AMERICAN: 30 mL/min — AB (ref >60)
Glucose, Bld: 153 mg/dL — ABNORMAL HIGH (ref 65–99)
Potassium: 4.7 mmol/L (ref 3.5–5.1)
Sodium: 140 mmol/L (ref 135–145)

## 2017-08-21 LAB — URINALYSIS, ROUTINE W REFLEX MICROSCOPIC
BILIRUBIN URINE: NEGATIVE
Glucose, UA: NEGATIVE mg/dL
Hgb urine dipstick: NEGATIVE
Ketones, ur: NEGATIVE mg/dL
LEUKOCYTES UA: NEGATIVE
NITRITE: NEGATIVE
PH: 5 (ref 5.0–8.0)
Protein, ur: NEGATIVE mg/dL
SPECIFIC GRAVITY, URINE: 1.016 (ref 1.005–1.030)

## 2017-08-21 MED ORDER — GUAIFENESIN-DM 100-10 MG/5ML PO SYRP
5.0000 mL | ORAL_SOLUTION | ORAL | Status: DC | PRN
  Administered 2017-08-21: 5 mL via ORAL
  Filled 2017-08-21: qty 5

## 2017-08-21 MED ORDER — IPRATROPIUM-ALBUTEROL 0.5-2.5 (3) MG/3ML IN SOLN
3.0000 mL | Freq: Four times a day (QID) | RESPIRATORY_TRACT | Status: DC
  Administered 2017-08-21 – 2017-08-22 (×5): 3 mL via RESPIRATORY_TRACT
  Filled 2017-08-21 (×5): qty 3

## 2017-08-21 MED ORDER — METHYLPREDNISOLONE SODIUM SUCC 125 MG IJ SOLR
60.0000 mg | Freq: Two times a day (BID) | INTRAMUSCULAR | Status: DC
  Administered 2017-08-21 – 2017-08-23 (×4): 60 mg via INTRAVENOUS
  Filled 2017-08-21 (×4): qty 2

## 2017-08-21 MED ORDER — METHYLPREDNISOLONE SODIUM SUCC 125 MG IJ SOLR: 60.0000 mg | Freq: Two times a day (BID) | INTRAMUSCULAR | Status: DC

## 2017-08-21 MED ORDER — ENSURE ENLIVE PO LIQD
237.0000 mL | Freq: Two times a day (BID) | ORAL | Status: DC
  Administered 2017-08-21 – 2017-08-23 (×3): 237 mL via ORAL

## 2017-08-21 NOTE — Progress Notes (Signed)
Initial Nutrition Assessment  DOCUMENTATION CODES:  Not applicable  INTERVENTION:  Offer Ensure Enlive po BID, each supplement provides 350 kcal and 20 grams of protein.   Given patients likely underlying cancer and has desire for no treatment, would Recommend diet liberalization given her terminal status  NUTRITION DIAGNOSIS:  Increased nutrient needs related to chronic illness(COPD and likely underlying cancer) as evidenced by estimated nutritional requirements for these condition  GOAL:  Patient will meet greater than or equal to 90% of their needs  MONITOR:  PO intake, Supplement acceptance, Labs, Weight trends  REASON FOR ASSESSMENT:  Consult Assessment of nutrition requirement/status, COPD Protocol  ASSESSMENT:  76 y/o female PMHx Lung Cancer, CKD, COPD on home O2, HTN. Presented to ED with worsening SOB x1 day. Admitted for COPD exacerbation.   RD operating remotely on weekends.   Minimal chart information. Patient had been admitted this past February where she had a CT and CXR that was worrisome for cancer. Patient declined any follow up whatsoever over possible cancer. She had no interest in pursuing treatment at that time. Palliative care was consulted and patient was made DNR   Unfortunately, there are no weights from that encounter.  Unable to determine if the patient has lost weight or how much she has lost since then.   No documented PO intake at this time   Patient appears to be straightforward in goals of care. Will refrain from ordering aggressive supplementation. Will order ensure, however, would not press patient to consume if she does not want to. Also, feel that diet liberalization is appropriate in this setting.   Physical Exam: Unable to assess  Labs: H/H:10.5/34.3, Glu:120-15, BUN/Creat; 39/1.60, Albumin: 3.3 Meds: Lasix, methylprednisolone,   NUTRITION - FOCUSED PHYSICAL EXAM: Unable to assess at this time  Recent Labs  Lab 08/20/17 1600  08/21/17 0347  NA 137 140  K 3.8 4.7  CL 96* 101  CO2 32 29  BUN 31* 39*  CREATININE 1.40* 1.60*  CALCIUM 9.2 8.9  GLUCOSE 134* 153*   Diet Order:  Diet Heart Room service appropriate? Yes; Fluid consistency: Thin  EDUCATION NEEDS:  No education needs have been identified at this time  Skin:  Skin Assessment: Reviewed RN Assessment  Last BM:  11/24  Height:  Ht Readings from Last 1 Encounters:  08/20/17 5' 2.5" (1.588 m)   Weight:  Wt Readings from Last 1 Encounters:  08/20/17 107 lb 12.9 oz (48.9 kg)   Wt Readings from Last 10 Encounters:  08/20/17 107 lb 12.9 oz (48.9 kg)  11/03/16 53 lb (24 kg)  12/19/11 134 lb 8 oz (61 kg)  11/09/11 140 lb (63.5 kg)   Ideal Body Weight:  51.1 kg  BMI:  Body mass index is 19.4 kg/m.  Estimated Nutritional Needs:  Kcal:  1600-1800 (33-37 kcal/kg bw) Protein:  70-80g Pro (1.4-1.6 g/kg bw) Fluid:  >1.2 L (25 ml/kg bw)  Burtis Junes RD, LDN, CNSC Clinical Nutrition Pager: [phone] 08/21/2017 1:39 PM

## 2017-08-21 NOTE — Progress Notes (Signed)
PROGRESS NOTE                                                                                                                                                                                                             Patient Demographics:    Cheyenne Case, is a 76 y.o. female, DOB - [birth year], QJF:[id number]  Admit date - 08/20/2017   Admitting Physician Reubin Milan, MD  Outpatient Primary MD for the patient is [name], MD  LOS - 0  Outpatient Specialists: Dr Luan Pulling ( pulmonary)  Chief Complaint  Patient presents with  . Shortness of Breath       Brief Narrative   76 year old female with history of lung cancer, CKD, COPD on home O2 (2.5 L/min), hypertension and pulmonary hypertension presented to the ED with progressive shortness of breath for past 2-3 days with nonproductive cough and wheezing.  She required up to 4 L oxygen at home along with frequent use of nebulizer without much relief.  Reports extreme weakness.  Denies any fevers, chills, chest pain, palpitations, nausea, vomiting, abdominal pain, bowel or urinary symptoms or leg swellings.  Denies any sick contact or recent travel.  Reports being adherent to her medications. The ED she was afebrile with O2 sat of 88% on 4 L via nasal cannula.  Received duo nebs, IV Solu-Medrol.  Blood work was unremarkable with normal BNP.  Chest x-ray negative for acute infiltrate.  Patient admitted for acute COPD exacerbation.    Subjective:   Reports of breathing to be better since presentation.  Still having nonproductive cough.   Assessment  & Plan :    Principal Problem:   COPD exacerbation ([place]) Acute on chronic respiratory failure with hypoxia Continue to observe on telemetry.  IV Solu-Medrol (switched to 60 mg every 12 hours).  Continue scheduled duo nebs and as needed albuterol nebs.  Continue O2 via nasal cannula.  Supportive care with antitussives.     Active problems Essential hypertension Continue home dose of Lasix and lisinopril.  History of lung cancer Not on any treatment at present due to severe COPD.  Generalized weakness and protein calorie malnutrition PT and nutrition consult.     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  Appears euvolemic.   Code Status : DNR  Family Communication  : Brother at bedside  Disposition Plan  : Home tomorrow if improved  Barriers For Discharge : Active symptoms  Consults  : None  Procedures  : None  DVT Prophylaxis  :  Lovenox -   Lab Results  Component Value Date   PLT 454 (H) 08/21/2017    Antibiotics  :    Anti-infectives (From admission, onward)   None        Objective:   Vitals:   08/20/17 2238 08/21/17 0153 08/21/17 0534 08/21/17 0911  BP:   125/64   Pulse:   96   Resp:   20   Temp:   (!) 97.5 F (36.4 C)   TempSrc:   Oral   SpO2: 96% 98% 99% 97%  Weight:      Height:        Wt Readings from Last 3 Encounters:  08/20/17 48.9 kg (107 lb 12.9 oz)  11/03/16 24 kg (53 lb)  12/19/11 61 kg (134 lb 8 oz)     Intake/Output Summary (Last 24 hours) at 08/21/2017 1214 Last data filed at 08/21/2017 0535 Gross per 24 hour  Intake -  Output 20 ml  Net -20 ml     Physical Exam  Gen: Elderly thin built female not in distress HEENT: no pallor, moist mucosa, supple neck Chest: Few scattered rhonchi bilaterally CVS: N S1&S2, no murmurs, rubs or gallop GI: soft, NT, ND, BS+ Musculoskeletal: warm, no edema     Data Review:    CBC Recent Labs  Lab 08/20/17 1600 08/21/17 0347  WBC 6.0 4.5  HGB 11.4* 10.5*  HCT 38.3 34.3*  PLT 472* 454*  MCV 100.0 100.3*  MCH 29.8 30.7  MCHC 29.8* 30.6  RDW 12.2 12.3  LYMPHSABS 1.0  --   MONOABS 0.8  --   EOSABS 0.2  --   BASOSABS 0.0  --     Chemistries  Recent Labs  Lab 08/20/17 1600 08/21/17 0347  NA 137 140  K 3.8 4.7  CL 96* 101  CO2 32 29  GLUCOSE 134* 153*  BUN 31* 39*   CREATININE 1.40* 1.60*  CALCIUM 9.2 8.9   ------------------------------------------------------------------------------------------------------------------ No results for input(s): CHOL, HDL, LDLCALC, TRIG, CHOLHDL, LDLDIRECT in the last 72 hours.  No results found for: HGBA1C ------------------------------------------------------------------------------------------------------------------ No results for input(s): TSH, T4TOTAL, T3FREE, THYROIDAB in the last 72 hours.  Invalid input(s): FREET3 ------------------------------------------------------------------------------------------------------------------ No results for input(s): VITAMINB12, FOLATE, FERRITIN, TIBC, IRON, RETICCTPCT in the last 72 hours.  Coagulation profile Recent Labs  Lab 08/20/17 1600  INR 0.97    No results for input(s): DDIMER in the last 72 hours.  Cardiac Enzymes Recent Labs  Lab 08/20/17 1600  TROPONINI <0.03   ------------------------------------------------------------------------------------------------------------------    Component Value Date/Time   BNP 126.0 (H) 08/20/2017 1600    Inpatient Medications  Scheduled Meds: . enoxaparin (LOVENOX) injection  30 mg Subcutaneous Q24H  . furosemide  20 mg Oral BID  . ipratropium-albuterol  3 mL Nebulization Q6H WA  . lisinopril  5 mg Oral Daily  . methylPREDNISolone (SOLU-MEDROL) injection  40 mg Intravenous Q6H   Continuous Infusions: PRN Meds:.albuterol  Micro Results No results found for this or any previous visit (from the past 240 hour(s)).  Radiology Reports Dg Chest Port 1 View  Result Date: 08/20/2017 CLINICAL DATA:  Shortness of breath. EXAM: PORTABLE CHEST 1 VIEW COMPARISON:  Radiographs and CT scan of November 03, 2016. FINDINGS: Stable cardiomediastinal silhouette. Atherosclerosis of thoracic aorta is noted. Lobulated mass is noted in left upper  lobe which is significantly increased in size compared to prior exam and consistent  with malignancy. No pneumothorax is noted. Minimal bibasilar subsegmental atelectasis is noted. Minimal bilateral pleural effusions are noted. Bony thorax is unremarkable. IMPRESSION: Left upper lobe mass is significantly enlarged compared to prior exam, consistent with malignancy. CT scan of the chest is recommended for further evaluation Probable minimal bibasilar subsegmental atelectasis is noted with minimal bilateral pleural effusions. Electronically Signed   By: Marijo Conception, M.D.   On: 08/20/2017 16:28    Time Spent in minutes  25   Quintez Maselli M.D on 08/21/2017 at 12:14 PM  Between 7am to 7pm - Pager - [phone]  After 7pm go to [email or web address] - password [facility]  [facility] -  Office  [phone]

## 2017-08-22 DIAGNOSIS — J449 Chronic obstructive pulmonary disease, unspecified: Secondary | ICD-10-CM | POA: Diagnosis not present

## 2017-08-22 DIAGNOSIS — I2723 Pulmonary hypertension due to lung diseases and hypoxia: Secondary | ICD-10-CM | POA: Diagnosis not present

## 2017-08-22 DIAGNOSIS — J441 Chronic obstructive pulmonary disease with (acute) exacerbation: Secondary | ICD-10-CM | POA: Diagnosis not present

## 2017-08-22 MED ORDER — ALBUTEROL SULFATE (2.5 MG/3ML) 0.083% IN NEBU
INHALATION_SOLUTION | RESPIRATORY_TRACT | Status: AC
  Filled 2017-08-22: qty 3

## 2017-08-22 MED ORDER — ALPRAZOLAM 0.25 MG PO TABS
0.2500 mg | ORAL_TABLET | Freq: Two times a day (BID) | ORAL | Status: DC | PRN
  Administered 2017-08-22 – 2017-08-24 (×4): 0.25 mg via ORAL
  Filled 2017-08-22 (×4): qty 1

## 2017-08-22 MED ORDER — LEVALBUTEROL HCL 0.63 MG/3ML IN NEBU
0.6300 mg | INHALATION_SOLUTION | Freq: Four times a day (QID) | RESPIRATORY_TRACT | Status: DC
  Administered 2017-08-22 – 2017-08-23 (×4): 0.63 mg via RESPIRATORY_TRACT
  Filled 2017-08-22 (×4): qty 3

## 2017-08-22 MED ORDER — IPRATROPIUM BROMIDE 0.02 % IN SOLN
0.5000 mg | Freq: Four times a day (QID) | RESPIRATORY_TRACT | Status: DC
  Administered 2017-08-22 – 2017-08-23 (×4): 0.5 mg via RESPIRATORY_TRACT
  Filled 2017-08-22 (×4): qty 2.5

## 2017-08-22 NOTE — Plan of Care (Signed)
Pt alert and oriented x4. Able to make needs known. No complaint of pain or distress at this time. O2 3l, [place]. Tel in place. Breath sounds diminished. Call light within reach. Bed in lowest position will continue to monitor.

## 2017-08-22 NOTE — Progress Notes (Addendum)
PROGRESS NOTE                                                                                                                                                                                                             Patient Demographics:    Cheyenne Case, is a 76 y.o. female, DOB - 1940-11-22, HCW:[id number]  Admit date - 08/20/2017   Admitting Physician Reubin Milan, MD  Outpatient Primary MD for the patient is [name], MD  LOS - 0  Outpatient Specialists: Dr Luan Pulling ( pulmonary)  Chief Complaint  Patient presents with  . Shortness of Breath       Brief Narrative   76 year old female with history of lung cancer, CKD, COPD on home O2 (2.5 L/min), hypertension and pulmonary hypertension presented to the ED with progressive shortness of breath for past 2-3 days with nonproductive cough and wheezing.  She required up to 4 L oxygen at home along with frequent use of nebulizer without much relief.  Reports extreme weakness.  Denies any fevers, chills, chest pain, palpitations, nausea, vomiting, abdominal pain, bowel or urinary symptoms or leg swellings.  Denies any sick contact or recent travel.  Reports being adherent to her medications. The ED she was afebrile with O2 sat of 88% on 4 L via nasal cannula.  Received duo nebs, IV Solu-Medrol.  Blood work was unremarkable with normal BNP.  Chest x-ray negative for acute infiltrate.  Patient admitted for acute COPD exacerbation.    Subjective:   breathing Improved but still feels weak and tremulous after receiving albuterol neb. Patient reports that she gets jittery and tremulous every time she takes albuterol.Contact guarding to 120s on the monitor.   Assessment  & Plan :    Principal Problem:   COPD exacerbation ([place]) Acute on chronic respiratory failure with hypoxia Continue to observe on telemetry.  IV Solu-Medrol and scheduled Atrovent nebs. Switch  albuterol to Xopenex due to tachycardia. Continue O2 via nasal cannula. Cirrhotic with antitussives.  Active problems Essential hypertension Continue home dose of Lasix and lisinopril.  History of lung cancer Not on any treatment at present due to severe COPD.  Generalized weakness  PT  consult.  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  Appears euvolemic.  Acute on Chronic kidney disease stage II Baseline creatinine around 1.3. Mildly worsened. Will  hold lasix and lisinopril and monitor in am.  Code Status : DNR  Family Communication  : Brother and niece at bedside  Disposition Plan  : Home tomorrow if improved  Barriers For Discharge : Active symptoms  Consults  : None  Procedures  : None  DVT Prophylaxis  :  Lovenox -   Lab Results  Component Value Date   PLT 454 (H) 08/21/2017    Antibiotics  :    Anti-infectives (From admission, onward)   None        Objective:   Vitals:   08/21/17 2023 08/22/17 0434 08/22/17 0620 08/22/17 0801  BP: (!) 117/55  (!) 124/46   Pulse: 94  92   Resp: 16  16   Temp: 98 F (36.7 C)  98.2 F (36.8 C)   TempSrc: Oral  Oral   SpO2: 96% 98% 99% 98%  Weight:      Height:        Wt Readings from Last 3 Encounters:  08/20/17 48.9 kg (107 lb 12.9 oz)  11/03/16 24 kg (53 lb)  12/19/11 61 kg (134 lb 8 oz)     Intake/Output Summary (Last 24 hours) at 08/22/2017 1125 Last data filed at 08/21/2017 1700 Gross per 24 hour  Intake 600 ml  Output -  Net 600 ml     Physical Exam Gen: elderly thin built female not in distress but appears fatigued and tremulous HEENT: Moist, supple neck Chest: Scattered rhonchi bilaterally CVS: Son and S2 tachycardic, no murmurs rub and gallop GI: Soft, nondistended, nontender Musculoskeletal: Warm, no edema      Data Review:    CBC Recent Labs  Lab 08/20/17 1600 08/21/17 0347  WBC 6.0 4.5  HGB 11.4* 10.5*  HCT 38.3 34.3*  PLT 472* 454*  MCV 100.0 100.3*    MCH 29.8 30.7  MCHC 29.8* 30.6  RDW 12.2 12.3  LYMPHSABS 1.0  --   MONOABS 0.8  --   EOSABS 0.2  --   BASOSABS 0.0  --     Chemistries  Recent Labs  Lab 08/20/17 1600 08/21/17 0347  NA 137 140  K 3.8 4.7  CL 96* 101  CO2 32 29  GLUCOSE 134* 153*  BUN 31* 39*  CREATININE 1.40* 1.60*  CALCIUM 9.2 8.9   ------------------------------------------------------------------------------------------------------------------ No results for input(s): CHOL, HDL, LDLCALC, TRIG, CHOLHDL, LDLDIRECT in the last 72 hours.  No results found for: HGBA1C ------------------------------------------------------------------------------------------------------------------ No results for input(s): TSH, T4TOTAL, T3FREE, THYROIDAB in the last 72 hours.  Invalid input(s): FREET3 ------------------------------------------------------------------------------------------------------------------ No results for input(s): VITAMINB12, FOLATE, FERRITIN, TIBC, IRON, RETICCTPCT in the last 72 hours.  Coagulation profile Recent Labs  Lab 08/20/17 1600  INR 0.97    No results for input(s): DDIMER in the last 72 hours.  Cardiac Enzymes Recent Labs  Lab 08/20/17 1600  TROPONINI <0.03   ------------------------------------------------------------------------------------------------------------------    Component Value Date/Time   BNP 126.0 (H) 08/20/2017 1600    Inpatient Medications  Scheduled Meds: . enoxaparin (LOVENOX) injection  30 mg Subcutaneous Q24H  . feeding supplement (ENSURE ENLIVE)  237 mL Oral BID BM  . furosemide  20 mg Oral BID  . ipratropium  0.5 mg Nebulization Q6H  . levalbuterol  0.63 mg Nebulization Q6H  . lisinopril  5 mg Oral Daily  . methylPREDNISolone (SOLU-MEDROL) injection  60 mg Intravenous Q12H   Continuous Infusions: PRN Meds:.guaiFENesin-dextromethorphan  Micro Results No results found for this or any previous visit (from the past 240 hour(s)).  Radiology  Reports Dg Chest Port 1 View  Result Date: 08/20/2017 CLINICAL DATA:  Shortness of breath. EXAM: PORTABLE CHEST 1 VIEW COMPARISON:  Radiographs and CT scan of November 03, 2016. FINDINGS: Stable cardiomediastinal silhouette. Atherosclerosis of thoracic aorta is noted. Lobulated mass is noted in left upper lobe which is significantly increased in size compared to prior exam and consistent with malignancy. No pneumothorax is noted. Minimal bibasilar subsegmental atelectasis is noted. Minimal bilateral pleural effusions are noted. Bony thorax is unremarkable. IMPRESSION: Left upper lobe mass is significantly enlarged compared to prior exam, consistent with malignancy. CT scan of the chest is recommended for further evaluation Probable minimal bibasilar subsegmental atelectasis is noted with minimal bilateral pleural effusions. Electronically Signed   By: Marijo Conception, M.D.   On: 08/20/2017 16:28    Time Spent in minutes  25   Amee Boothe M.D on 08/22/2017 at 11:25 AM  Between 7am to 7pm - Pager - [phone]  After 7pm go to [email or web address] - password [facility]  [facility] -  Office  [phone]

## 2017-08-23 DIAGNOSIS — C3410 Malignant neoplasm of upper lobe, unspecified bronchus or lung: Secondary | ICD-10-CM | POA: Diagnosis not present

## 2017-08-23 DIAGNOSIS — I10 Essential (primary) hypertension: Secondary | ICD-10-CM | POA: Diagnosis not present

## 2017-08-23 DIAGNOSIS — J441 Chronic obstructive pulmonary disease with (acute) exacerbation: Secondary | ICD-10-CM | POA: Diagnosis not present

## 2017-08-23 LAB — BASIC METABOLIC PANEL
ANION GAP: 9 (ref 5–15)
BUN: 49 mg/dL — ABNORMAL HIGH (ref 6–20)
CO2: 31 mmol/L (ref 22–32)
Calcium: 9 mg/dL (ref 8.9–10.3)
Chloride: 98 mmol/L — ABNORMAL LOW (ref 101–111)
Creatinine, Ser: 1.33 mg/dL — ABNORMAL HIGH (ref 0.44–1.00)
GFR, EST AFRICAN AMERICAN: 43 mL/min — AB (ref >60)
GFR, EST NON AFRICAN AMERICAN: 37 mL/min — AB (ref >60)
Glucose, Bld: 147 mg/dL — ABNORMAL HIGH (ref 65–99)
POTASSIUM: 4.9 mmol/L (ref 3.5–5.1)
SODIUM: 138 mmol/L (ref 135–145)

## 2017-08-23 MED ORDER — IPRATROPIUM-ALBUTEROL 0.5-2.5 (3) MG/3ML IN SOLN
3.0000 mL | Freq: Four times a day (QID) | RESPIRATORY_TRACT | Status: DC
  Administered 2017-08-23 – 2017-08-24 (×3): 3 mL via RESPIRATORY_TRACT
  Filled 2017-08-23 (×3): qty 3

## 2017-08-23 MED ORDER — PREDNISONE 20 MG PO TABS
50.0000 mg | ORAL_TABLET | Freq: Every day | ORAL | Status: DC
  Administered 2017-08-24: 50 mg via ORAL
  Filled 2017-08-23: qty 2

## 2017-08-23 MED ORDER — ALBUTEROL SULFATE (2.5 MG/3ML) 0.083% IN NEBU
2.5000 mg | INHALATION_SOLUTION | RESPIRATORY_TRACT | Status: DC | PRN
  Administered 2017-08-24: 2.5 mg via RESPIRATORY_TRACT
  Filled 2017-08-23: qty 3

## 2017-08-23 MED ORDER — FUROSEMIDE 20 MG PO TABS
20.0000 mg | ORAL_TABLET | Freq: Every day | ORAL | Status: DC
  Administered 2017-08-23 – 2017-08-24 (×2): 20 mg via ORAL
  Filled 2017-08-23 (×2): qty 1

## 2017-08-23 MED ORDER — METHYLPREDNISOLONE SODIUM SUCC 125 MG IJ SOLR
60.0000 mg | Freq: Two times a day (BID) | INTRAMUSCULAR | Status: AC
  Administered 2017-08-23: 60 mg via INTRAVENOUS
  Filled 2017-08-23: qty 2

## 2017-08-23 NOTE — Progress Notes (Signed)
PROGRESS NOTE                                                                                                                                                                                                             Patient Demographics:    Cheyenne Case, is a 76 y.o. female, DOB - 04-13-1941, DAP:[id number]  Admit date - 08/20/2017   Admitting Physician Reubin Milan, MD  Outpatient Primary MD for the patient is Cheyenne Lincoln, MD  LOS - 0  Outpatient Specialists: Dr Luan Pulling ( pulmonary)  Chief Complaint  Patient presents with  . Shortness of Breath       Brief Narrative   76 year old female with history of lung cancer, CKD, COPD on home O2 (2.5 L/min), hypertension and pulmonary hypertension presented to the ED with progressive shortness of breath for past 2-3 days with nonproductive cough and wheezing.  She required up to 4 L oxygen at home along with frequent use of nebulizer without much relief.  Reported extreme weakness. ED she was afebrile with O2 sat of 88% on 4 L via nasal cannula.  Received duo nebs, IV Solu-Medrol.  Blood work was unremarkable with normal BNP.  Chest x-ray negative for acute infiltrate.  Patient admitted for acute COPD exacerbation.    Subjective:   -Breathing improving, not back to baseline yet, extremely upset about getting Xopenex instead of albuterol since yesterday   Assessment  & Plan :     COPD exacerbation (HCC)/Acute on chronic respiratory failure with hypoxia -Improving, not back to baseline yet -Continue IV Solu-Medrol today, transitioned to prednisone tomorrow -Restart albuterol and Atrovent nebs, instead of Xopenex per patient request, tachycardia much improved now -Ambulate today, physical therapy evaluation -Breathing O2 down to 2.5 L  Essential hypertension -Stable, restart low-dose Lasix per home regimen  History of lung cancer -No further  treatment/evaluation pursued due to advanced COPD and overall poor prognosis per patient request  Generalized weakness  -Due to above, physical therapy eval today  Pulmonary hypertension due to COPD ([place]) -Stable, restart Lasix  Acute on Chronic kidney disease stage II Baseline creatinine around 1.3. Mildly worsened on admission, now improved back to baseline -lisinopril on hold, restart Lasix   DVT prophylaxis: Lovenox   Code Status : DNR Family Communication  : Brother and niece at bedside Disposition Plan  :  Home tomorrow if improved   Lab Results  Component Value Date   PLT 454 (H) 08/21/2017    Antibiotics  :    Anti-infectives (From admission, onward)   None        Objective:   Vitals:   08/23/17 0505 08/23/17 0811 08/23/17 1406 08/23/17 1544  BP: (!) 123/58   (!) 130/58  Pulse: 84   94  Resp: 20     Temp: (!) 97.5 F (36.4 C)   97.9 F (36.6 C)  TempSrc: Oral   Oral  SpO2: 98% 99% 99% 100%  Weight:      Height:        Wt Readings from Last 3 Encounters:  08/20/17 48.9 kg (107 lb 12.9 oz)  11/03/16 24 kg (53 lb)  12/19/11 61 kg (134 lb 8 oz)     Intake/Output Summary (Last 24 hours) at 08/23/2017 1641 Last data filed at 08/23/2017 0800 Gross per 24 hour  Intake 240 ml  Output -  Net 240 ml     Physical Exam Gen: Frail, chronically ill female sitting in bed, no distress HEENT: PERRLA, Neck supple, no JVD Lungs: Poor air movement, no wheezes noted CVS: RRR,No Gallops,Rubs or new Murmurs Abd: soft, Non tender, non distended, BS present Extremities: Trace edema Skin: no new rashes    Data Review:    CBC Recent Labs  Lab 08/20/17 1600 08/21/17 0347  WBC 6.0 4.5  HGB 11.4* 10.5*  HCT 38.3 34.3*  PLT 472* 454*  MCV 100.0 100.3*  MCH 29.8 30.7  MCHC 29.8* 30.6  RDW 12.2 12.3  LYMPHSABS 1.0  --   MONOABS 0.8  --   EOSABS 0.2  --   BASOSABS 0.0  --     Chemistries  Recent Labs  Lab 08/20/17 1600 08/21/17 0347  08/23/17 0653  NA 137 140 138  K 3.8 4.7 4.9  CL 96* 101 98*  CO2 32 29 31  GLUCOSE 134* 153* 147*  BUN 31* 39* 49*  CREATININE 1.40* 1.60* 1.33*  CALCIUM 9.2 8.9 9.0   ------------------------------------------------------------------------------------------------------------------ No results for input(s): CHOL, HDL, LDLCALC, TRIG, CHOLHDL, LDLDIRECT in the last 72 hours.  No results found for: HGBA1C ------------------------------------------------------------------------------------------------------------------ No results for input(s): TSH, T4TOTAL, T3FREE, THYROIDAB in the last 72 hours.  Invalid input(s): FREET3 ------------------------------------------------------------------------------------------------------------------ No results for input(s): VITAMINB12, FOLATE, FERRITIN, TIBC, IRON, RETICCTPCT in the last 72 hours.  Coagulation profile Recent Labs  Lab 08/20/17 1600  INR 0.97    No results for input(s): DDIMER in the last 72 hours.  Cardiac Enzymes Recent Labs  Lab 08/20/17 1600  TROPONINI <0.03   ------------------------------------------------------------------------------------------------------------------    Component Value Date/Time   BNP 126.0 (H) 08/20/2017 1600    Inpatient Medications  Scheduled Meds: . enoxaparin (LOVENOX) injection  30 mg Subcutaneous Q24H  . feeding supplement (ENSURE ENLIVE)  237 mL Oral BID BM  . furosemide  20 mg Oral Daily  . ipratropium-albuterol  3 mL Nebulization Q6H  . methylPREDNISolone (SOLU-MEDROL) injection  60 mg Intravenous Q12H  . [START ON 08/24/2017] predniSONE  50 mg Oral Q breakfast   Continuous Infusions: PRN Meds:.albuterol, ALPRAZolam, guaiFENesin-dextromethorphan  Micro Results No results found for this or any previous visit (from the past 240 hour(s)).  Radiology Reports Dg Chest Port 1 View  Result Date: 08/20/2017 CLINICAL DATA:  Shortness of breath. EXAM: PORTABLE CHEST 1 VIEW  COMPARISON:  Radiographs and CT scan of November 03, 2016. FINDINGS: Stable cardiomediastinal silhouette. Atherosclerosis of thoracic aorta  is noted. Lobulated mass is noted in left upper lobe which is significantly increased in size compared to prior exam and consistent with malignancy. No pneumothorax is noted. Minimal bibasilar subsegmental atelectasis is noted. Minimal bilateral pleural effusions are noted. Bony thorax is unremarkable. IMPRESSION: Left upper lobe mass is significantly enlarged compared to prior exam, consistent with malignancy. CT scan of the chest is recommended for further evaluation Probable minimal bibasilar subsegmental atelectasis is noted with minimal bilateral pleural effusions. Electronically Signed   By: Marijo Conception, M.D.   On: 08/20/2017 16:28    Time Spent in minutes  25   Domenic Polite M.D on 08/23/2017 at 4:41 PM  Page via [email or web address], password [facility] after 7pm call floor coverage  [facility] -  Office  [phone]

## 2017-08-23 NOTE — Care Management Note (Signed)
Case Management Note  Patient Details  Name: Cheyenne Case MRN: [id number] Date of Birth: [birth year]  Subjective/Objective:   Adm with COPD exacerbation. History of lung cancer. Wears oxygen pta provided by [facility]. She lives with her brother. She reports ind with ADL's, no other DME. Has had [facility] in the past. Recommended for [facility] PT. She is reluctant to agree, however brother at bedside states she needs any help that is available. Patient elects [facility] again.                Action/Plan: Anticipate DC home with [facility] . Juliann Pulse of [facility] notified and will obtain orders from Epic when available. Patient will need HH/face to face ordered prior to discharge.   Expected Discharge Date:    08/23/2017              Expected Discharge Plan:  [place]  In-House Referral:     Discharge planning Services  CM Consult  Post Acute Care Choice:  Home Health Choice offered to:  Patient  DME Arranged:    DME Agency:     HH Arranged:  PT [place]:  [place]  Status of Service:  Completed, signed off  If discussed at [place] of Stay Meetings, dates discussed:    Additional Comments:  Asjia Berrios, Chauncey Reading, RN 08/23/2017, 11:57 AM

## 2017-08-23 NOTE — Evaluation (Signed)
Physical Therapy Evaluation Patient Details Name: Cheyenne Case MRN: [id number] DOB: 1940-10-04 Today's Date: 08/23/2017   History of Present Illness  Cheyenne Case is a 76 y.o. female with medical history significant of lung cancer, chronic kidney disease, COPD on home oxygen at 2.5 LPM, emphysema, hypertension, physical deconditioning, vitamin D deficiency, pulmonary hypertension who is coming to the emergency department with complaints of progressively worse shortness of breath despite increasing her oxygen to 4 LPM.  This is associated with dry cough and wheezing.  Patient also stated that she has been feeling her heart "quivering", but she has been using a lot of albuterol.  She denies fever, chills, but feels fatigued.  No sore throat, hemoptysis, chest pain, dizziness, diaphoresis, lower extremity edema, abdominal pain, nausea, emesis, diarrhea, melena or hematochezia.  Denies GU symptoms.  No polyuria or polydipsia.    Clinical Impression  Patient functioning at baseline for functional mobility and gait with only c/o of feeling weaker than normal.  Patient discharged from physical therapy to care of nursing for ambulation daily as tolerated for length of stay.  Patient will benefit from follow up home health PT for strengthening exercises and to increase activity tolerance at home.    Follow Up Recommendations Home health PT    Equipment Recommendations  None recommended by PT    Recommendations for Other Services       Precautions / Restrictions Precautions Precautions: Fall Restrictions Weight Bearing Restrictions: No      Mobility  Bed Mobility Overal bed mobility: Independent                Transfers Overall transfer level: Modified independent Equipment used: Rolling walker (2 wheeled)                Ambulation/Gait Ambulation/Gait assistance: Modified independent (Device/Increase time) Ambulation Distance (Feet): 65 Feet Assistive device: Rolling  walker (2 wheeled) Gait Pattern/deviations: WFL(Within Functional Limits);Trunk flexed   Gait velocity interpretation: Below normal speed for age/gender General Gait Details: no loss of balance, on 3 LPM O2  Stairs            Wheelchair Mobility    Modified Rankin (Stroke Patients Only)       Balance Overall balance assessment: Needs assistance Sitting-balance support: No upper extremity supported;Feet supported Sitting balance-Leahy Scale: Good     Standing balance support: Bilateral upper extremity supported;During functional activity Standing balance-Leahy Scale: Good                               Pertinent Vitals/Pain Pain Assessment: No/denies pain    Home Living Family/patient expects to be discharged to:: Private residence Living Arrangements: Other relatives(lives with her brother) Available Help at Discharge: Available 24 hours/day;Family Type of Home: House Home Access: Stairs to enter Entrance Stairs-Rails: Right Entrance Stairs-Number of Steps: 5 Home Layout: One level Home Equipment: Environmental consultant - 4 wheels      Prior Function Level of Independence: Independent with assistive device(s)               Hand Dominance        Extremity/Trunk Assessment   Upper Extremity Assessment Upper Extremity Assessment: Overall WFL for tasks assessed    Lower Extremity Assessment Lower Extremity Assessment: Overall WFL for tasks assessed    Cervical / Trunk Assessment Cervical / Trunk Assessment: Kyphotic  Communication   Communication: No difficulties  Cognition Arousal/Alertness: Awake/alert Behavior During Therapy: WFL for tasks  assessed/performed Overall Cognitive Status: Within Functional Limits for tasks assessed                                        General Comments      Exercises     Assessment/Plan    PT Assessment All further PT needs can be met in the next venue of care  PT Problem List Decreased  strength;Decreased activity tolerance       PT Treatment Interventions      PT Goals (Current goals can be found in the Care Plan section)  Acute Rehab PT Goals Patient Stated Goal: return home with her brother to assist PT Goal Formulation: With patient/family Time For Goal Achievement: 08/23/17 Potential to Achieve Goals: Good    Frequency     Barriers to discharge        Co-evaluation               AM-PAC PT "6 Clicks" Daily Activity  Outcome Measure Difficulty turning over in bed (including adjusting bedclothes, sheets and blankets)?: None Difficulty moving from lying on back to sitting on the side of the bed? : None Difficulty sitting down on and standing up from a chair with arms (e.g., wheelchair, bedside commode, etc,.)?: None Help needed moving to and from a bed to chair (including a wheelchair)?: None Help needed walking in hospital room?: None Help needed climbing 3-5 steps with a railing? : A Little 6 Click Score: 23    End of Session   Activity Tolerance: Patient tolerated treatment well;Patient limited by fatigue Patient left: in bed(seated at bedside) Nurse Communication: Mobility status PT Visit Diagnosis: Unsteadiness on feet (R26.81);Other abnormalities of gait and mobility (R26.89);Muscle weakness (generalized) (M62.81)    Time: 1020-1041 PT Time Calculation (min) (ACUTE ONLY): 21 min   Charges:   PT Evaluation $PT Eval Low Complexity: 1 Low PT Treatments $Therapeutic Activity: 8-22 mins   PT G Codes:   PT G-Codes **NOT FOR INPATIENT CLASS** Functional Assessment Tool Used: AM-PAC 6 Clicks Basic Mobility Functional Limitation: Mobility: Walking and moving around Mobility: Walking and Moving Around Current Status (C4818): At least 1 percent but less than 20 percent impaired, limited or restricted Mobility: Walking and Moving Around Goal Status [phone]): At least 1 percent but less than 20 percent impaired, limited or restricted Mobility:  Walking and Moving Around Discharge Status [phone]): At least 1 percent but less than 20 percent impaired, limited or restricted    12:09 PM, 08/23/17 Lonell Grandchild, MPT Physical Therapist with [facility] 336 [phone] office [phone] mobile phone

## 2017-08-24 DIAGNOSIS — J441 Chronic obstructive pulmonary disease with (acute) exacerbation: Secondary | ICD-10-CM | POA: Diagnosis not present

## 2017-08-24 LAB — BASIC METABOLIC PANEL
Anion gap: 7 (ref 5–15)
BUN: 47 mg/dL — AB (ref 6–20)
CO2: 32 mmol/L (ref 22–32)
CREATININE: 1.23 mg/dL — AB (ref 0.44–1.00)
Calcium: 9 mg/dL (ref 8.9–10.3)
Chloride: 100 mmol/L — ABNORMAL LOW (ref 101–111)
GFR, EST AFRICAN AMERICAN: 48 mL/min — AB (ref >60)
GFR, EST NON AFRICAN AMERICAN: 41 mL/min — AB (ref >60)
Glucose, Bld: 143 mg/dL — ABNORMAL HIGH (ref 65–99)
POTASSIUM: 4.7 mmol/L (ref 3.5–5.1)
SODIUM: 139 mmol/L (ref 135–145)

## 2017-08-24 MED ORDER — IPRATROPIUM-ALBUTEROL 0.5-2.5 (3) MG/3ML IN SOLN: 3.0000 mL | Freq: Four times a day (QID) | RESPIRATORY_TRACT | 0 refills | Status: AC | PRN

## 2017-08-24 MED ORDER — PREDNISONE 10 MG PO TABS: ORAL_TABLET | ORAL | 0 refills | Status: AC

## 2017-08-24 MED ORDER — ENSURE ENLIVE PO LIQD: 237.0000 mL | Freq: Two times a day (BID) | ORAL | 12 refills | Status: AC

## 2017-08-24 NOTE — Progress Notes (Signed)
Cheyenne Case discharged Home per MD order.  Discharge instructions reviewed and discussed with the patient, all questions and concerns answered. Copy of instructions and scripts given to patient.  Allergies as of 08/24/2017   No Known Allergies     Medication List    STOP taking these medications   albuterol (2.5 MG/3ML) 0.083% nebulizer solution Commonly known as:  PROVENTIL     TAKE these medications   budesonide-formoterol 160-4.5 MCG/ACT inhaler Commonly known as:  SYMBICORT Inhale 2 puffs into the lungs 2 (two) times daily.   feeding supplement (ENSURE ENLIVE) Liqd Take 237 mLs by mouth 2 (two) times daily between meals.   furosemide 20 MG tablet Commonly known as:  LASIX Take 20 mg by mouth 2 (two) times daily.   ipratropium-albuterol 0.5-2.5 (3) MG/3ML Soln Commonly known as:  DUONEB Take 3 mLs by nebulization every 6 (six) hours as needed.   lisinopril 5 MG tablet Commonly known as:  PRINIVIL,ZESTRIL Take 1 tablet (5 mg total) by mouth daily.   predniSONE 10 MG tablet Commonly known as:  DELTASONE Prednisone 4m po daily for 4 days, then 467mpo daily for 4 days, then 3026mo daily for 4 days, then 25m67m daily for 4 days and then 10mg25mdaily for 4 days and stop.   SPIRIVA HANDIHALER 18 MCG inhalation capsule Generic drug:  tiotropium Take 1 capsule by mouth daily at 2 PM.       Patients skin is clean, dry and intact, no evidence of skin break down. IV site discontinued and catheter remains intact. Site without signs and symptoms of complications. Dressing and pressure applied.  Patient escorted to car in a wheelchair,  no distress noted upon discharge.  BonniRalene Case 08/24/2017 1:04 PM

## 2017-08-24 NOTE — Care Management CC44 (Signed)
Condition Code 44 Documentation Completed  Patient Details  Name: Cheyenne Case MRN: [id number] Date of Birth: Oct 17, 1940   Condition Code 44 given:  Yes Patient signature on Condition Code 44 notice:  Yes Documentation of 2 MD's agreement:  Yes Code 44 added to claim:  Yes    Catera Hankins, Chauncey Reading, RN 08/24/2017, 12:33 PM

## 2017-08-24 NOTE — Care Management Obs Status (Signed)
[facility] NOTIFICATION   Patient Details  Name: Cheyenne Case MRN: [id number] Date of Birth: 06-24-1941   Medicare Observation Status Notification Given:  Yes    Sonnie Bias, Chauncey Reading, RN 08/24/2017, 12:34 PM

## 2017-08-24 NOTE — Discharge Summary (Signed)
Physician Discharge Summary  Patient ID: Cheyenne Case MRN: [id number] DOB/AGE: [birth year] 76 y.o.  Admit date: 08/20/2017 Discharge date: 08/24/2017  Admission Diagnoses:  Discharge Diagnoses:  Principal Problem:   COPD exacerbation ([place]) Active Problems:   Benign hypertension   Pulmonary hypertension due to COPD ([place])   Lung cancer ([place])   Discharged Condition: Optimized  Hospital Course: Patient is a 76 year old female with past medical history significant for lung cancer, chronic kidney disease stage 3, COPD on home oxygen at 2.5 LPM, hypertension, physical deconditioning, vitamin D deficiency and pulmonary hypertension. Patient was admitted with acute on chronic respiratory failure and COPD with acute exacerbation. Patient was admitted for further assessment and management. Patient was managed with IV steroids and Nebulizer treatment. With above management, patient has improved significantly, and back to her baseline. Patient maintains that the suspected lung cancer is not new, and she is followed up by a Pulmonologist. Patient is eager to be discharged back home today.   Consults: None   Discharge Exam: Blood pressure (!) 155/83, pulse 100, temperature (!) 97.5 F (36.4 C), temperature source Oral, resp. rate 20, height 5' 2.5" (1.588 m), weight 48.8 kg (107 lb 9.4 oz), SpO2 100 %.    Disposition: 06-Home-Health Care Svc  Discharge Instructions    Diet - low sodium heart healthy   Complete by:  As directed    Discharge instructions   Complete by:  As directed    Please call MD for worsening of symptoms   Increase activity slowly   Complete by:  As directed      Allergies as of 08/24/2017   No Known Allergies     Medication List    STOP taking these medications   albuterol (2.5 MG/3ML) 0.083% nebulizer solution Commonly known as:  PROVENTIL     TAKE these medications   budesonide-formoterol 160-4.5 MCG/ACT inhaler Commonly known as:  SYMBICORT Inhale 2  puffs into the lungs 2 (two) times daily.   feeding supplement (ENSURE ENLIVE) Liqd Take 237 mLs by mouth 2 (two) times daily between meals.   furosemide 20 MG tablet Commonly known as:  LASIX Take 20 mg by mouth 2 (two) times daily.   ipratropium-albuterol 0.5-2.5 (3) MG/3ML Soln Commonly known as:  DUONEB Take 3 mLs by nebulization every 6 (six) hours as needed.   lisinopril 5 MG tablet Commonly known as:  PRINIVIL,ZESTRIL Take 1 tablet (5 mg total) by mouth daily.   predniSONE 10 MG tablet Commonly known as:  DELTASONE Prednisone 21m po daily for 4 days, then 460mpo daily for 4 days, then 3022mo daily for 4 days, then 51m45m daily for 4 days and then 10mg46mdaily for 4 days and stop.   SPIRIVA HANDIHALER 18 MCG inhalation capsule Generic drug:  tiotropium Take 1 capsule by mouth daily at 2 PM.        Signed: SylveBonnell Public7/2018, 11:43 AM

## 2017-08-27 DIAGNOSIS — R0602 Shortness of breath: Secondary | ICD-10-CM | POA: Diagnosis not present

## 2017-08-27 DIAGNOSIS — Z85118 Personal history of other malignant neoplasm of bronchus and lung: Secondary | ICD-10-CM | POA: Diagnosis not present

## 2017-08-27 DIAGNOSIS — J449 Chronic obstructive pulmonary disease, unspecified: Secondary | ICD-10-CM | POA: Diagnosis not present

## 2017-08-27 DIAGNOSIS — I10 Essential (primary) hypertension: Secondary | ICD-10-CM | POA: Diagnosis not present

## 2017-08-27 DIAGNOSIS — N183 Chronic kidney disease, stage 3 (moderate): Secondary | ICD-10-CM | POA: Diagnosis not present

## 2017-08-28 DIAGNOSIS — R0602 Shortness of breath: Secondary | ICD-10-CM | POA: Diagnosis not present

## 2017-08-28 DIAGNOSIS — J449 Chronic obstructive pulmonary disease, unspecified: Secondary | ICD-10-CM | POA: Diagnosis not present

## 2017-08-28 DIAGNOSIS — I10 Essential (primary) hypertension: Secondary | ICD-10-CM | POA: Diagnosis not present

## 2017-08-28 DIAGNOSIS — N183 Chronic kidney disease, stage 3 (moderate): Secondary | ICD-10-CM | POA: Diagnosis not present

## 2017-08-28 DIAGNOSIS — Z85118 Personal history of other malignant neoplasm of bronchus and lung: Secondary | ICD-10-CM | POA: Diagnosis not present

## 2017-08-30 DIAGNOSIS — I10 Essential (primary) hypertension: Secondary | ICD-10-CM | POA: Diagnosis not present

## 2017-08-30 DIAGNOSIS — R0602 Shortness of breath: Secondary | ICD-10-CM | POA: Diagnosis not present

## 2017-08-30 DIAGNOSIS — N183 Chronic kidney disease, stage 3 (moderate): Secondary | ICD-10-CM | POA: Diagnosis not present

## 2017-08-30 DIAGNOSIS — Z85118 Personal history of other malignant neoplasm of bronchus and lung: Secondary | ICD-10-CM | POA: Diagnosis not present

## 2017-08-30 DIAGNOSIS — J449 Chronic obstructive pulmonary disease, unspecified: Secondary | ICD-10-CM | POA: Diagnosis not present

## 2017-09-02 DIAGNOSIS — Z85118 Personal history of other malignant neoplasm of bronchus and lung: Secondary | ICD-10-CM | POA: Diagnosis not present

## 2017-09-02 DIAGNOSIS — R0602 Shortness of breath: Secondary | ICD-10-CM | POA: Diagnosis not present

## 2017-09-02 DIAGNOSIS — I10 Essential (primary) hypertension: Secondary | ICD-10-CM | POA: Diagnosis not present

## 2017-09-02 DIAGNOSIS — J449 Chronic obstructive pulmonary disease, unspecified: Secondary | ICD-10-CM | POA: Diagnosis not present

## 2017-09-02 DIAGNOSIS — N183 Chronic kidney disease, stage 3 (moderate): Secondary | ICD-10-CM | POA: Diagnosis not present

## 2017-09-08 DIAGNOSIS — J449 Chronic obstructive pulmonary disease, unspecified: Secondary | ICD-10-CM | POA: Diagnosis not present

## 2017-09-08 DIAGNOSIS — Z85118 Personal history of other malignant neoplasm of bronchus and lung: Secondary | ICD-10-CM | POA: Diagnosis not present

## 2017-09-08 DIAGNOSIS — R0602 Shortness of breath: Secondary | ICD-10-CM | POA: Diagnosis not present

## 2017-09-08 DIAGNOSIS — N183 Chronic kidney disease, stage 3 (moderate): Secondary | ICD-10-CM | POA: Diagnosis not present

## 2017-09-08 DIAGNOSIS — I10 Essential (primary) hypertension: Secondary | ICD-10-CM | POA: Diagnosis not present

## 2017-09-10 DIAGNOSIS — N183 Chronic kidney disease, stage 3 (moderate): Secondary | ICD-10-CM | POA: Diagnosis not present

## 2017-09-10 DIAGNOSIS — Z85118 Personal history of other malignant neoplasm of bronchus and lung: Secondary | ICD-10-CM | POA: Diagnosis not present

## 2017-09-10 DIAGNOSIS — I10 Essential (primary) hypertension: Secondary | ICD-10-CM | POA: Diagnosis not present

## 2017-09-10 DIAGNOSIS — R0602 Shortness of breath: Secondary | ICD-10-CM | POA: Diagnosis not present

## 2017-09-10 DIAGNOSIS — J449 Chronic obstructive pulmonary disease, unspecified: Secondary | ICD-10-CM | POA: Diagnosis not present

## 2017-09-13 DIAGNOSIS — Z85118 Personal history of other malignant neoplasm of bronchus and lung: Secondary | ICD-10-CM | POA: Diagnosis not present

## 2017-09-13 DIAGNOSIS — R0602 Shortness of breath: Secondary | ICD-10-CM | POA: Diagnosis not present

## 2017-09-13 DIAGNOSIS — I10 Essential (primary) hypertension: Secondary | ICD-10-CM | POA: Diagnosis not present

## 2017-09-13 DIAGNOSIS — N183 Chronic kidney disease, stage 3 (moderate): Secondary | ICD-10-CM | POA: Diagnosis not present

## 2017-09-13 DIAGNOSIS — J449 Chronic obstructive pulmonary disease, unspecified: Secondary | ICD-10-CM | POA: Diagnosis not present

## 2017-09-14 DIAGNOSIS — Z85118 Personal history of other malignant neoplasm of bronchus and lung: Secondary | ICD-10-CM | POA: Diagnosis not present

## 2017-09-14 DIAGNOSIS — J449 Chronic obstructive pulmonary disease, unspecified: Secondary | ICD-10-CM | POA: Diagnosis not present

## 2017-09-14 DIAGNOSIS — R0602 Shortness of breath: Secondary | ICD-10-CM | POA: Diagnosis not present

## 2017-09-14 DIAGNOSIS — N183 Chronic kidney disease, stage 3 (moderate): Secondary | ICD-10-CM | POA: Diagnosis not present

## 2017-09-14 DIAGNOSIS — I10 Essential (primary) hypertension: Secondary | ICD-10-CM | POA: Diagnosis not present

## 2017-09-16 DIAGNOSIS — I10 Essential (primary) hypertension: Secondary | ICD-10-CM | POA: Diagnosis not present

## 2017-09-16 DIAGNOSIS — R0602 Shortness of breath: Secondary | ICD-10-CM | POA: Diagnosis not present

## 2017-09-16 DIAGNOSIS — N183 Chronic kidney disease, stage 3 (moderate): Secondary | ICD-10-CM | POA: Diagnosis not present

## 2017-09-16 DIAGNOSIS — Z85118 Personal history of other malignant neoplasm of bronchus and lung: Secondary | ICD-10-CM | POA: Diagnosis not present

## 2017-09-16 DIAGNOSIS — J449 Chronic obstructive pulmonary disease, unspecified: Secondary | ICD-10-CM | POA: Diagnosis not present

## 2017-09-22 DIAGNOSIS — R0602 Shortness of breath: Secondary | ICD-10-CM | POA: Diagnosis not present

## 2017-09-22 DIAGNOSIS — N183 Chronic kidney disease, stage 3 (moderate): Secondary | ICD-10-CM | POA: Diagnosis not present

## 2017-09-22 DIAGNOSIS — I10 Essential (primary) hypertension: Secondary | ICD-10-CM | POA: Diagnosis not present

## 2017-09-22 DIAGNOSIS — J449 Chronic obstructive pulmonary disease, unspecified: Secondary | ICD-10-CM | POA: Diagnosis not present

## 2017-09-22 DIAGNOSIS — Z85118 Personal history of other malignant neoplasm of bronchus and lung: Secondary | ICD-10-CM | POA: Diagnosis not present

## 2017-09-23 DIAGNOSIS — N183 Chronic kidney disease, stage 3 (moderate): Secondary | ICD-10-CM | POA: Diagnosis not present

## 2017-09-23 DIAGNOSIS — I10 Essential (primary) hypertension: Secondary | ICD-10-CM | POA: Diagnosis not present

## 2017-09-23 DIAGNOSIS — J449 Chronic obstructive pulmonary disease, unspecified: Secondary | ICD-10-CM | POA: Diagnosis not present

## 2017-09-23 DIAGNOSIS — R0602 Shortness of breath: Secondary | ICD-10-CM | POA: Diagnosis not present

## 2017-09-23 DIAGNOSIS — Z85118 Personal history of other malignant neoplasm of bronchus and lung: Secondary | ICD-10-CM | POA: Diagnosis not present

## 2017-09-27 DIAGNOSIS — R0602 Shortness of breath: Secondary | ICD-10-CM | POA: Diagnosis not present

## 2017-09-27 DIAGNOSIS — Z85118 Personal history of other malignant neoplasm of bronchus and lung: Secondary | ICD-10-CM | POA: Diagnosis not present

## 2017-09-27 DIAGNOSIS — I10 Essential (primary) hypertension: Secondary | ICD-10-CM | POA: Diagnosis not present

## 2017-09-27 DIAGNOSIS — N183 Chronic kidney disease, stage 3 (moderate): Secondary | ICD-10-CM | POA: Diagnosis not present

## 2017-09-27 DIAGNOSIS — J449 Chronic obstructive pulmonary disease, unspecified: Secondary | ICD-10-CM | POA: Diagnosis not present

## 2017-09-28 DIAGNOSIS — J449 Chronic obstructive pulmonary disease, unspecified: Secondary | ICD-10-CM | POA: Diagnosis not present

## 2017-09-28 DIAGNOSIS — R0602 Shortness of breath: Secondary | ICD-10-CM | POA: Diagnosis not present

## 2017-09-28 DIAGNOSIS — Z85118 Personal history of other malignant neoplasm of bronchus and lung: Secondary | ICD-10-CM | POA: Diagnosis not present

## 2017-09-28 DIAGNOSIS — I10 Essential (primary) hypertension: Secondary | ICD-10-CM | POA: Diagnosis not present

## 2017-09-28 DIAGNOSIS — N183 Chronic kidney disease, stage 3 (moderate): Secondary | ICD-10-CM | POA: Diagnosis not present

## 2017-10-01 DIAGNOSIS — N183 Chronic kidney disease, stage 3 (moderate): Secondary | ICD-10-CM | POA: Diagnosis not present

## 2017-10-01 DIAGNOSIS — J449 Chronic obstructive pulmonary disease, unspecified: Secondary | ICD-10-CM | POA: Diagnosis not present

## 2017-10-01 DIAGNOSIS — Z85118 Personal history of other malignant neoplasm of bronchus and lung: Secondary | ICD-10-CM | POA: Diagnosis not present

## 2017-10-01 DIAGNOSIS — R0602 Shortness of breath: Secondary | ICD-10-CM | POA: Diagnosis not present

## 2017-10-01 DIAGNOSIS — I10 Essential (primary) hypertension: Secondary | ICD-10-CM | POA: Diagnosis not present

## 2017-10-05 DIAGNOSIS — I10 Essential (primary) hypertension: Secondary | ICD-10-CM | POA: Diagnosis not present

## 2017-10-05 DIAGNOSIS — Z85118 Personal history of other malignant neoplasm of bronchus and lung: Secondary | ICD-10-CM | POA: Diagnosis not present

## 2017-10-05 DIAGNOSIS — N183 Chronic kidney disease, stage 3 (moderate): Secondary | ICD-10-CM | POA: Diagnosis not present

## 2017-10-05 DIAGNOSIS — R0602 Shortness of breath: Secondary | ICD-10-CM | POA: Diagnosis not present

## 2017-10-05 DIAGNOSIS — J449 Chronic obstructive pulmonary disease, unspecified: Secondary | ICD-10-CM | POA: Diagnosis not present

## 2017-10-06 DIAGNOSIS — I10 Essential (primary) hypertension: Secondary | ICD-10-CM | POA: Diagnosis not present

## 2017-10-06 DIAGNOSIS — R0602 Shortness of breath: Secondary | ICD-10-CM | POA: Diagnosis not present

## 2017-10-06 DIAGNOSIS — J449 Chronic obstructive pulmonary disease, unspecified: Secondary | ICD-10-CM | POA: Diagnosis not present

## 2017-10-06 DIAGNOSIS — N183 Chronic kidney disease, stage 3 (moderate): Secondary | ICD-10-CM | POA: Diagnosis not present

## 2017-10-06 DIAGNOSIS — Z85118 Personal history of other malignant neoplasm of bronchus and lung: Secondary | ICD-10-CM | POA: Diagnosis not present

## 2017-10-07 DIAGNOSIS — N183 Chronic kidney disease, stage 3 (moderate): Secondary | ICD-10-CM | POA: Diagnosis not present

## 2017-10-07 DIAGNOSIS — I10 Essential (primary) hypertension: Secondary | ICD-10-CM | POA: Diagnosis not present

## 2017-10-07 DIAGNOSIS — Z85118 Personal history of other malignant neoplasm of bronchus and lung: Secondary | ICD-10-CM | POA: Diagnosis not present

## 2017-10-07 DIAGNOSIS — R0602 Shortness of breath: Secondary | ICD-10-CM | POA: Diagnosis not present

## 2017-10-07 DIAGNOSIS — J449 Chronic obstructive pulmonary disease, unspecified: Secondary | ICD-10-CM | POA: Diagnosis not present

## 2017-10-08 DIAGNOSIS — I10 Essential (primary) hypertension: Secondary | ICD-10-CM | POA: Diagnosis not present

## 2017-10-08 DIAGNOSIS — Z85118 Personal history of other malignant neoplasm of bronchus and lung: Secondary | ICD-10-CM | POA: Diagnosis not present

## 2017-10-08 DIAGNOSIS — J449 Chronic obstructive pulmonary disease, unspecified: Secondary | ICD-10-CM | POA: Diagnosis not present

## 2017-10-08 DIAGNOSIS — N183 Chronic kidney disease, stage 3 (moderate): Secondary | ICD-10-CM | POA: Diagnosis not present

## 2017-10-08 DIAGNOSIS — R0602 Shortness of breath: Secondary | ICD-10-CM | POA: Diagnosis not present

## 2017-10-09 DIAGNOSIS — N183 Chronic kidney disease, stage 3 (moderate): Secondary | ICD-10-CM | POA: Diagnosis not present

## 2017-10-09 DIAGNOSIS — J449 Chronic obstructive pulmonary disease, unspecified: Secondary | ICD-10-CM | POA: Diagnosis not present

## 2017-10-09 DIAGNOSIS — I10 Essential (primary) hypertension: Secondary | ICD-10-CM | POA: Diagnosis not present

## 2017-10-09 DIAGNOSIS — R0602 Shortness of breath: Secondary | ICD-10-CM | POA: Diagnosis not present

## 2017-10-09 DIAGNOSIS — Z85118 Personal history of other malignant neoplasm of bronchus and lung: Secondary | ICD-10-CM | POA: Diagnosis not present

## 2017-10-10 DIAGNOSIS — I10 Essential (primary) hypertension: Secondary | ICD-10-CM | POA: Diagnosis not present

## 2017-10-10 DIAGNOSIS — R0602 Shortness of breath: Secondary | ICD-10-CM | POA: Diagnosis not present

## 2017-10-10 DIAGNOSIS — Z85118 Personal history of other malignant neoplasm of bronchus and lung: Secondary | ICD-10-CM | POA: Diagnosis not present

## 2017-10-10 DIAGNOSIS — J449 Chronic obstructive pulmonary disease, unspecified: Secondary | ICD-10-CM | POA: Diagnosis not present

## 2017-10-10 DIAGNOSIS — N183 Chronic kidney disease, stage 3 (moderate): Secondary | ICD-10-CM | POA: Diagnosis not present

## 2017-10-29 DEATH — deceased

## 2018-03-14 IMAGING — CR DG CHEST 1V PORT
1 series · 1 of 1 positions shown · non-contrast
Comparison: Radiographs and CT scan November 03, 2016.

CLINICAL DATA: Shortness of breath.

EXAM:
PORTABLE CHEST 1 VIEW

[portable]
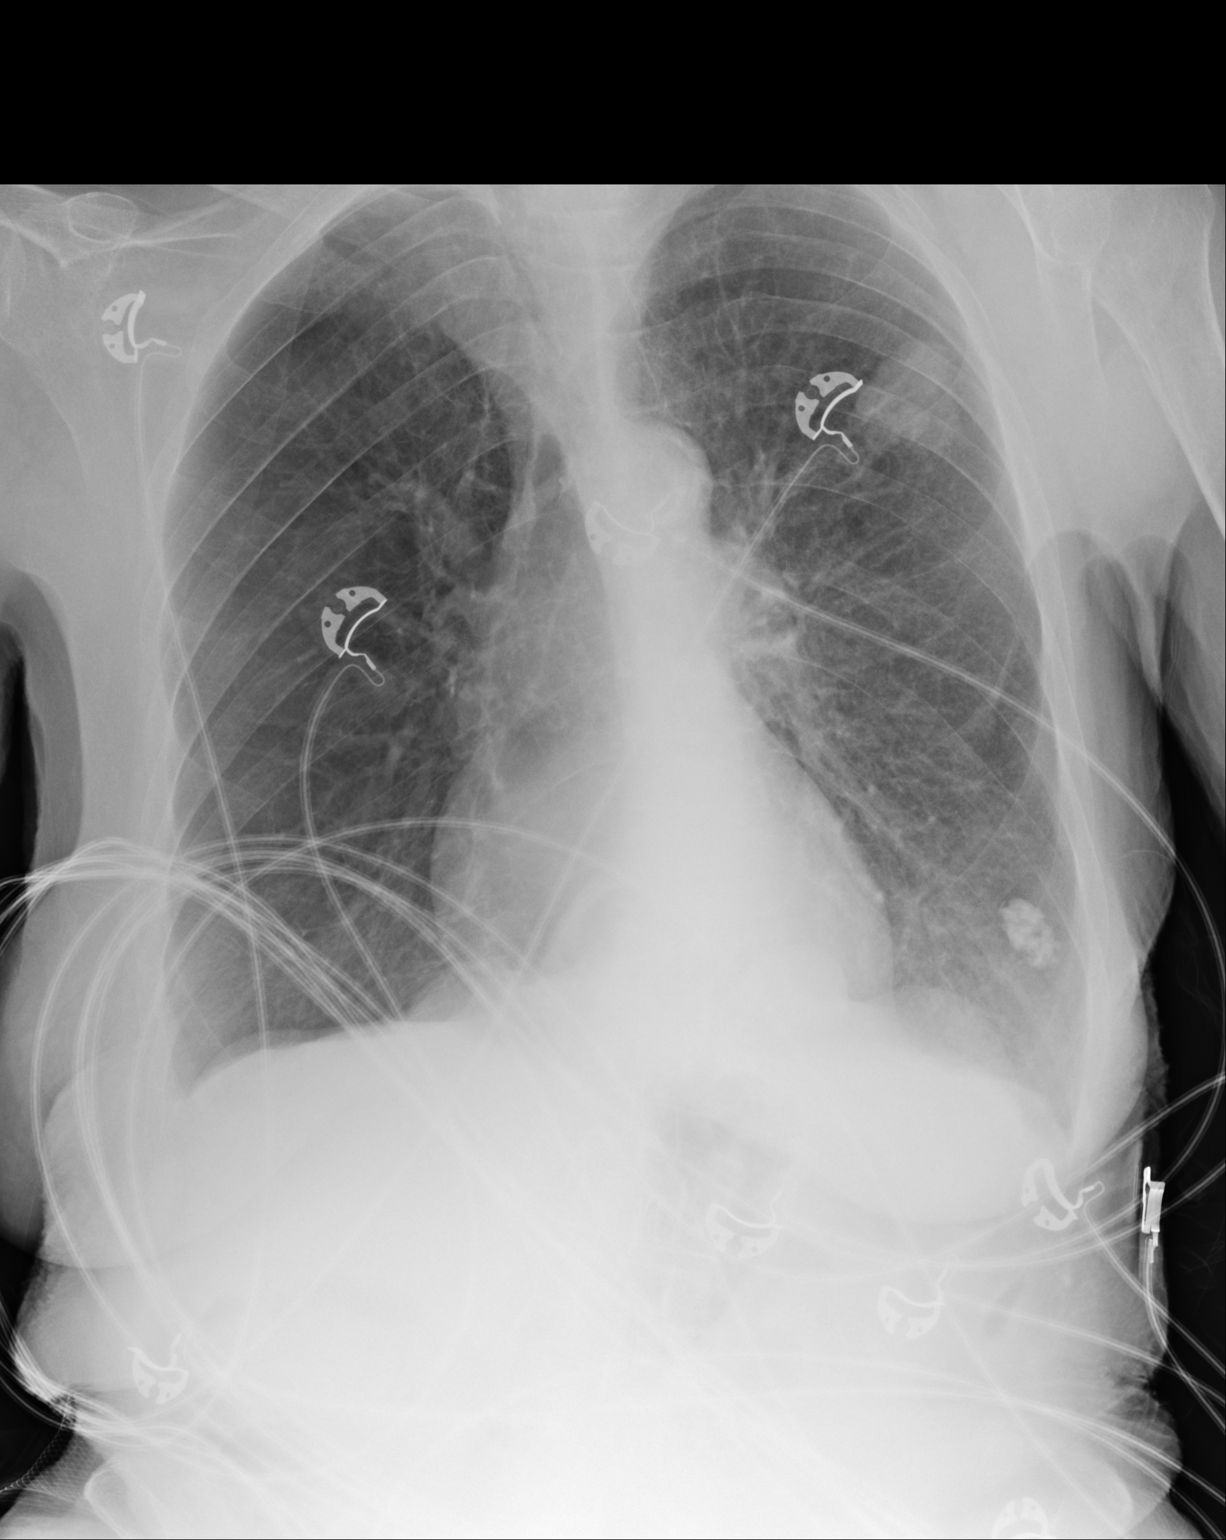

[1 of 1 positions shown; findings below may reference images not displayed]

FINDINGS: Stable cardiomediastinal silhouette. Atherosclerosis of thoracic
aorta is noted. Lobulated mass is noted in left upper lobe which is
significantly increased in size compared to prior exam and
consistent with malignancy. No pneumothorax is noted. Minimal
bibasilar subsegmental atelectasis is noted. Minimal bilateral
pleural effusions are noted. Bony thorax is unremarkable.
IMPRESSION: Left upper lobe mass is significantly enlarged compared to prior
exam, consistent with malignancy. CT scan of the chest is
recommended for further evaluation

Probable minimal bibasilar subsegmental atelectasis is noted with
minimal bilateral pleural effusions.
